# Patient Record
Sex: Male | Born: 1985 | Race: White | Hispanic: No | Marital: Married | Smoking: Current some day smoker
Health system: Southern US, Community
[De-identification: ages and names within clinical notes are randomized; demographics above are authoritative.]

## PROBLEM LIST (undated history)

## (undated) DIAGNOSIS — M109 Gout, unspecified: Secondary | ICD-10-CM

## (undated) DIAGNOSIS — F419 Anxiety disorder, unspecified: Secondary | ICD-10-CM

## (undated) DIAGNOSIS — E785 Hyperlipidemia, unspecified: Secondary | ICD-10-CM

## (undated) DIAGNOSIS — F32A Depression, unspecified: Secondary | ICD-10-CM

## (undated) HISTORY — DX: Anxiety disorder, unspecified: F41.9

## (undated) HISTORY — PX: TESTICLE TORSION REDUCTION: SHX795

## (undated) HISTORY — DX: Hyperlipidemia, unspecified: E78.5

## (undated) HISTORY — DX: Depression, unspecified: F32.A

## (undated) HISTORY — DX: Gout, unspecified: M10.9

## (undated) HISTORY — PX: WISDOM TOOTH EXTRACTION: SHX21

---

## 2012-06-19 DIAGNOSIS — K589 Irritable bowel syndrome without diarrhea: Secondary | ICD-10-CM | POA: Insufficient documentation

## 2015-08-07 DIAGNOSIS — E786 Lipoprotein deficiency: Secondary | ICD-10-CM | POA: Insufficient documentation

## 2015-08-07 DIAGNOSIS — E782 Mixed hyperlipidemia: Secondary | ICD-10-CM | POA: Insufficient documentation

## 2016-08-17 DIAGNOSIS — R7303 Prediabetes: Secondary | ICD-10-CM | POA: Insufficient documentation

## 2016-08-17 DIAGNOSIS — E669 Obesity, unspecified: Secondary | ICD-10-CM | POA: Insufficient documentation

## 2016-08-17 DIAGNOSIS — F1010 Alcohol abuse, uncomplicated: Secondary | ICD-10-CM | POA: Insufficient documentation

## 2016-08-17 DIAGNOSIS — E66811 Obesity, class 1: Secondary | ICD-10-CM | POA: Insufficient documentation

## 2016-11-22 DIAGNOSIS — F411 Generalized anxiety disorder: Secondary | ICD-10-CM | POA: Insufficient documentation

## 2017-02-22 DIAGNOSIS — F3341 Major depressive disorder, recurrent, in partial remission: Secondary | ICD-10-CM | POA: Insufficient documentation

## 2021-05-11 ENCOUNTER — Ambulatory Visit: Admission: EM | Admit: 2021-05-11 | Payer: BC Managed Care – PPO | Source: Home / Self Care

## 2021-05-11 ENCOUNTER — Other Ambulatory Visit: Payer: Self-pay

## 2021-05-11 ENCOUNTER — Ambulatory Visit
Admission: EM | Admit: 2021-05-11 | Discharge: 2021-05-11 | Disposition: A | Discharge status: Home / Self Care | Payer: BC Managed Care – PPO | Attending: Sports Medicine | Admitting: Sports Medicine

## 2021-05-11 DIAGNOSIS — M25562 Pain in left knee: Secondary | ICD-10-CM | POA: Insufficient documentation

## 2021-05-11 DIAGNOSIS — M10062 Idiopathic gout, left knee: Secondary | ICD-10-CM | POA: Insufficient documentation

## 2021-05-11 DIAGNOSIS — M25462 Effusion, left knee: Secondary | ICD-10-CM | POA: Insufficient documentation

## 2021-05-11 LAB — CBC WITH DIFFERENTIAL/PLATELET
Abs Immature Granulocytes: 0.02 10*3/uL (ref 0.00–0.07)
Basophils Absolute: 0 10*3/uL (ref 0.0–0.1)
Basophils Relative: 0 %
Eosinophils Absolute: 0 10*3/uL (ref 0.0–0.5)
Eosinophils Relative: 0 %
HCT: 43.7 % (ref 39.0–52.0)
Hemoglobin: 15.4 g/dL (ref 13.0–17.0)
Immature Granulocytes: 0 %
Lymphocytes Relative: 20 %
Lymphs Abs: 1.7 10*3/uL (ref 0.7–4.0)
MCH: 30.3 pg (ref 26.0–34.0)
MCHC: 35.2 g/dL (ref 30.0–36.0)
MCV: 86 fL (ref 80.0–100.0)
Monocytes Absolute: 0.5 10*3/uL (ref 0.1–1.0)
Monocytes Relative: 5 %
Neutro Abs: 6.6 10*3/uL (ref 1.7–7.7)
Neutrophils Relative %: 75 %
Platelets: 273 10*3/uL (ref 150–400)
RBC: 5.08 MIL/uL (ref 4.22–5.81)
RDW: 11.7 % (ref 11.5–15.5)
WBC: 8.8 10*3/uL (ref 4.0–10.5)
nRBC: 0 % (ref 0.0–0.2)

## 2021-05-11 LAB — C-REACTIVE PROTEIN: CRP: 9.3 mg/dL — ABNORMAL HIGH (ref <1.0)

## 2021-05-11 LAB — COMPREHENSIVE METABOLIC PANEL
ALT: 24 U/L (ref 0–44)
AST: 18 U/L (ref 15–41)
Albumin: 4.7 g/dL (ref 3.5–5.0)
Alkaline Phosphatase: 99 U/L (ref 38–126)
Anion gap: 10 (ref 5–15)
BUN: 15 mg/dL (ref 6–20)
CO2: 25 mmol/L (ref 22–32)
Calcium: 9.4 mg/dL (ref 8.9–10.3)
Chloride: 102 mmol/L (ref 98–111)
Creatinine, Ser: 0.95 mg/dL (ref 0.61–1.24)
GFR, Estimated: >60 mL/min (ref >60)
Glucose, Bld: 97 mg/dL (ref 70–99)
Potassium: 3.9 mmol/L (ref 3.5–5.1)
Sodium: 137 mmol/L (ref 135–145)
Total Bilirubin: 0.6 mg/dL (ref 0.3–1.2)
Total Protein: 8.6 g/dL — ABNORMAL HIGH (ref 6.5–8.1)

## 2021-05-11 LAB — URIC ACID: Uric Acid, Serum: 10.2 mg/dL — ABNORMAL HIGH (ref 3.7–8.6)

## 2021-05-11 LAB — SEDIMENTATION RATE: Sed Rate: 18 mm/hr — ABNORMAL HIGH (ref 0–15)

## 2021-05-11 MED ORDER — KETOROLAC TROMETHAMINE 60 MG/2ML IM SOLN
60.0000 mg | Freq: Once | INTRAMUSCULAR | Status: AC
  Administered 2021-05-11: 60 mg via INTRAMUSCULAR

## 2021-05-11 MED ORDER — PREDNISONE 10 MG (21) PO TBPK: ORAL_TABLET | Freq: Every day | ORAL | 0 refills | Status: DC

## 2021-05-11 MED ORDER — TRAMADOL HCL 50 MG PO TABS: 50.0000 mg | ORAL_TABLET | Freq: Four times a day (QID) | ORAL | 0 refills | Status: DC | PRN

## 2021-05-11 NOTE — ED Provider Notes (Signed)
[name]    CSN: [id number] Arrival date & time: 05/11/21  1036      History   Chief Complaint Chief Complaint  Patient presents with   Knee Pain    left    HPI Timothy Lawrence is a 35 y.o. male.   35 year old male presents for evaluation of significantly painful swollen left knee.  He denies any accidents trauma or falls.  He has had it now for 5 days and is progressed to the point where he is in tears taking history and during the physical exam.  He has pain ambulating.  He can get comfortable at home.  No fever shakes chills.  Thinks there might be a little bit of warmth to it.  He had similar symptoms about a year ago and was told he had gout.  He was prescribed allopurinol but he did not take it because he read the side effects.  He works as an Chief Financial Officer.  He does not have a primary care provider.  He denies any nausea vomiting or diarrhea.  No urinary or abdominal symptoms.  No COVID symptoms.  No URI symptoms.  No open wounds or active infection.  No recent travel.  No tick bites.  No red flag signs or symptoms elicited on history.   History reviewed. No pertinent past medical history.  There are no problems to display for this patient.   Past Surgical History:  Procedure Laterality Date   TESTICLE TORSION REDUCTION     WISDOM TOOTH EXTRACTION         Home Medications    Prior to Admission medications   Medication Sig Start Date End Date Taking? Authorizing Provider  predniSONE (STERAPRED UNI-PAK 21 TAB) 10 MG (21) TBPK tablet Take by mouth daily. Take 6 tabs by mouth daily  for 2 days, then 5 tabs for 2 days, then 4 tabs for 2 days, then 3 tabs for 2 days, 2 tabs for 2 days, then 1 tab by mouth daily for 2 days 05/11/21  Yes Verda Cumins, MD  traMADol (ULTRAM) 50 MG tablet Take 1 tablet (50 mg total) by mouth every 6 (six) hours as needed. 05/11/21  Yes Verda Cumins, MD    Family History Family History  Problem Relation Age of Onset   Multiple  sclerosis Mother    Healthy Father     Social History Social History   Tobacco Use   Smoking status: Some Days    Types: Cigars   Smokeless tobacco: Never  Vaping Use   Vaping Use: Never used  Substance Use Topics   Alcohol use: Yes    Comment: occasionally   Drug use: Not Currently     Allergies   Amoxicillin   Review of Systems Review of Systems  Constitutional:  Positive for activity change. Negative for appetite change, chills, diaphoresis, fatigue and fever.  HENT:  Negative for congestion, ear pain, postnasal drip, rhinorrhea, sinus pressure, sinus pain, sneezing and sore throat.   Eyes:  Negative for pain.  Respiratory:  Negative for cough, chest tightness and shortness of breath.   Cardiovascular:  Negative for chest pain and palpitations.  Gastrointestinal:  Negative for abdominal pain, diarrhea, nausea and vomiting.  Genitourinary:  Negative for dysuria.  Musculoskeletal:  Positive for arthralgias, gait problem and joint swelling. Negative for back pain, myalgias, neck pain and neck stiffness.  Skin:  Negative for pallor, rash and wound.  Neurological:  Negative for dizziness, light-headedness and headaches.  All other systems reviewed  and are negative.   Physical Exam Triage Vital Signs ED Triage Vitals  Enc Vitals Group     BP 05/11/21 1217 (!) 134/99     Pulse Rate 05/11/21 1217 84     Resp 05/11/21 1217 18     Temp 05/11/21 1217 98.7 F (37.1 C)     Temp Source 05/11/21 1217 Oral     SpO2 05/11/21 1217 99 %     Weight 05/11/21 1214 200 lb (90.7 kg)     Height 05/11/21 1214 '5\' 6"'  (1.676 m)     Head Circumference --      Peak Flow --      Pain Score 05/11/21 1214 8     Pain Loc --      Pain Edu? --      Excl. in [place]? --    No data found.  Updated Vital Signs BP (!) 134/99 (BP Location: Left Arm)   Pulse 84   Temp 98.7 F (37.1 C) (Oral)   Resp 18   Ht '5\' 6"'  (1.676 m)   Wt 90.7 kg   SpO2 99%   BMI 32.28 kg/m   Visual Acuity Right Eye  Distance:   Left Eye Distance:   Bilateral Distance:    Right Eye Near:   Left Eye Near:    Bilateral Near:     Physical Exam Vitals and nursing note reviewed.  Constitutional:      General: He is not in acute distress.    Appearance: Normal appearance. He is not ill-appearing, toxic-appearing or diaphoretic.  HENT:     Head: Normocephalic and atraumatic.     Nose: Nose normal.     Mouth/Throat:     Mouth: Mucous membranes are moist.  Eyes:     Conjunctiva/sclera: Conjunctivae normal.     Pupils: Pupils are equal, round, and reactive to light.  Cardiovascular:     Rate and Rhythm: Normal rate and regular rhythm.     Pulses: Normal pulses.     Heart sounds: Normal heart sounds. No murmur heard.   No friction rub. No gallop.  Pulmonary:     Effort: Pulmonary effort is normal.     Breath sounds: Normal breath sounds. No stridor. No wheezing, rhonchi or rales.  Musculoskeletal:        General: Swelling and tenderness present. No signs of injury.     Cervical back: Normal range of motion and neck supple.     Right knee: Normal.     Left knee: Swelling present. No deformity, erythema or ecchymosis. Decreased range of motion. Tenderness present.     Comments: Very limited exam with decreased range of motion, effusion, and significant pain.  Skin:    General: Skin is warm and dry.     Capillary Refill: Capillary refill takes less than 2 seconds.     Coloration: Skin is not jaundiced.     Findings: No bruising, erythema, lesion or rash.  Neurological:     General: No focal deficit present.     Mental Status: He is alert and oriented to person, place, and time.     UC Treatments / Results  Labs (all labs ordered are listed, but only abnormal results are displayed) Labs Reviewed  COMPREHENSIVE METABOLIC PANEL - Abnormal; Notable for the following components:      Result Value   Total Protein 8.6 (*)    All other components within normal limits  URIC ACID - Abnormal; Notable  for the following components:  Uric Acid, Serum 10.2 (*)    All other components within normal limits  SEDIMENTATION RATE - Abnormal; Notable for the following components:   Sed Rate 18 (*)    All other components within normal limits  C-REACTIVE PROTEIN - Abnormal; Notable for the following components:   CRP 9.3 (*)    All other components within normal limits  CBC WITH DIFFERENTIAL/PLATELET    EKG   Radiology No results found.  Procedures Procedures (including critical care time)  Medications Ordered in UC Medications  ketorolac (TORADOL) injection 60 mg (60 mg Intramuscular Given 05/11/21 1433)    Initial Impression / Assessment and Plan / UC Course  I have reviewed the triage vital signs and the nursing notes.  Pertinent labs & imaging results that were available during my care of the patient were reviewed by me and considered in my medical decision making (see chart for details).  Clinical impression: 35 year old male presents with left knee pain and swelling.  He has a background history of gout but has not taken any medicine.  Believe he has an acute gout flare.  No trauma.  No fever.  No erythema or concerning signs for an intra-articular infection.  Treatment plan: 1.  The findings and treatment plan were discussed in detail with the patient.  Patient was in agreement. 2.  Recommended getting some labs.  We did do a CBC which was normal.  C-Met showed a slight elevation in his protein but was otherwise within normal limits.  Uric acid was elevated at 10.2.  Sed rate was elevated at 18.  ESR was elevated at 9.3.  Is consistent with an acute gouty flare. 3.  Given his pain I went ahead and gave him 60 mg of IM Toradol in the office. 4.  I went ahead and gave him a 12-day prednisone taper which should help his symptoms.  I also prescribed some tramadol for his acute pain. 5.  Educational handouts provided. 6.  As he does not have a PCP I gave him the name of a local  orthopedic group that he should follow-up with to see whether or not an aspiration and culture as well as fluid analysis is indicated. 7.  He will need to be on allopurinol once he has this acute flare taken care of.  I had a long discussion with him about the importance of this so that he does not get another attack like this. 8.  I want him to keep a food log to see if there are any foods that is precipitating this.  He may well have CPPD as well. 9.  If he is not able to get into Ortho and his symptoms do not resolve then he should go to the ER for higher level of care. 10.  He was stable upon discharge and will follow-up here as needed.    Final Clinical Impressions(s) / UC Diagnoses   Final diagnoses:  Acute pain of left knee  Swelling of joint, knee, left  Acute idiopathic gout of left knee     Discharge Instructions      As we discussed, your uric acid level is quite high.  This is indicative of gout. I prescribed a 12-day prednisone taper which should help with your symptoms. We also gave you an intramuscular medicine for your pain. I also prescribed you some pain medication. You indicated that you did not need a work note. I have provided you educational handouts. Since you do not have a  primary care physician I have provided you the name of a local orthopedic group that you should follow-up with to see whether or not they can drain your knee and put some steroid in there to help with your pain and get you back on your feet quickly. If you are not able to get into orthopedics you could go to the ER and they could probably do the same.  If you worsen at all go to the ER.     ED Prescriptions     Medication Sig Dispense Auth. Provider   predniSONE (STERAPRED UNI-PAK 21 TAB) 10 MG (21) TBPK tablet Take by mouth daily. Take 6 tabs by mouth daily  for 2 days, then 5 tabs for 2 days, then 4 tabs for 2 days, then 3 tabs for 2 days, 2 tabs for 2 days, then 1 tab by mouth daily for  2 days 42 tablet Verda Cumins, MD   traMADol (ULTRAM) 50 MG tablet Take 1 tablet (50 mg total) by mouth every 6 (six) hours as needed. 15 tablet Verda Cumins, MD      I have reviewed the PDMP during this encounter.   Verda Cumins, MD 05/13/21 [phone]

## 2021-05-11 NOTE — ED Triage Notes (Signed)
Patient complains of left knee pain that started on Friday without injury. States that he is concerned this is gout.

## 2021-05-11 NOTE — Discharge Instructions (Addendum)
As we discussed, your uric acid level is quite high.  This is indicative of gout. I prescribed a 12-day prednisone taper which should help with your symptoms. We also gave you an intramuscular medicine for your pain. I also prescribed you some pain medication. You indicated that you did not need a work note. I have provided you educational handouts. Since you do not have a primary care physician I have provided you the name of a local orthopedic group that you should follow-up with to see whether or not they can drain your knee and put some steroid in there to help with your pain and get you back on your feet quickly. If you are not able to get into orthopedics you could go to the ER and they could probably do the same.  If you worsen at all go to the ER.

## 2021-05-13 DIAGNOSIS — M1A09X0 Idiopathic chronic gout, multiple sites, without tophus (tophi): Secondary | ICD-10-CM | POA: Insufficient documentation

## 2021-05-13 DIAGNOSIS — M109 Gout, unspecified: Secondary | ICD-10-CM | POA: Insufficient documentation

## 2021-05-13 DIAGNOSIS — M25562 Pain in left knee: Secondary | ICD-10-CM | POA: Diagnosis not present

## 2021-05-13 DIAGNOSIS — M25571 Pain in right ankle and joints of right foot: Secondary | ICD-10-CM | POA: Diagnosis not present

## 2021-05-14 ENCOUNTER — Telehealth: Payer: Self-pay

## 2021-05-14 NOTE — Telephone Encounter (Signed)
-----   Message from Aaron Edelman, RN sent at 05/14/2021  2:38 PM EDT ----- Regarding: UC to PCP Patient needs to establish with PCP - routine

## 2021-05-14 NOTE — Telephone Encounter (Signed)
Left voice mail to set up new patient appointment 

## 2021-05-27 ENCOUNTER — Other Ambulatory Visit: Payer: Self-pay

## 2021-05-27 ENCOUNTER — Other Ambulatory Visit: Payer: Self-pay | Admitting: Family Medicine

## 2021-05-27 ENCOUNTER — Encounter: Payer: Self-pay | Admitting: Family Medicine

## 2021-05-27 ENCOUNTER — Ambulatory Visit (INDEPENDENT_AMBULATORY_CARE_PROVIDER_SITE_OTHER): Payer: BC Managed Care – PPO | Admitting: Family Medicine

## 2021-05-27 ENCOUNTER — Inpatient Hospital Stay (INDEPENDENT_AMBULATORY_CARE_PROVIDER_SITE_OTHER): Payer: BC Managed Care – PPO | Admitting: Radiology

## 2021-05-27 VITALS — BP 124/86 | HR 68 | Temp 98.3°F | Ht 66.0 in | Wt 194.0 lb

## 2021-05-27 DIAGNOSIS — M1A09X0 Idiopathic chronic gout, multiple sites, without tophus (tophi): Secondary | ICD-10-CM | POA: Diagnosis not present

## 2021-05-27 DIAGNOSIS — M25462 Effusion, left knee: Secondary | ICD-10-CM | POA: Diagnosis not present

## 2021-05-27 HISTORY — DX: Effusion, left knee: M25.462

## 2021-05-27 MED ORDER — COLCHICINE 0.6 MG PO TABS
0.6000 mg | ORAL_TABLET | Freq: Two times a day (BID) | ORAL | 0 refills | Status: DC
Start: 2021-05-27 — End: 2021-06-24

## 2021-05-27 MED ORDER — TRIAMCINOLONE ACETONIDE 40 MG/ML IJ SUSP
40.0000 mg | Freq: Once | INTRAMUSCULAR | Status: AC
Start: 2021-05-27 — End: 2021-05-27
  Administered 2021-05-27: 40 mg

## 2021-05-27 NOTE — Assessment & Plan Note (Addendum)
Patient with stated history of chronic gout flares, typically associated with food/beer indulgences.  Has previously been prescribed allopurinol but decided to attempt dietary modifications which have been successful until this recent flare noted on 05/06/2021, went to urgent care on 05/11/2021 where uric acid level was seen to be elevated, treated with prednisone and dietary modifications.  Symptoms initially improved however they began to recur towards the end of the prednisone dose.  Currently his symptoms are primarily located at the left knee and right ankle, associated with swelling.  Pain control has been with OTC NSAIDs.  Examination significant for left knee to have 1-2+ effusion, general tenderness, no erythema, right ankle with mild anteromedial joint line pain.  Given the left knee effusion and symptomatology, he did consent to ultrasound-guided aspiration followed by intra-articular corticosteroid injection.  10 cc of synovial fluid that was somewhat cloudy in appearance was obtained, this is to be sent for further lab analysis with cultures and crystal evaluation.  Additionally, serum uric acid to be obtained so we can trend his prior value.  For additional symptom control colchicine twice daily scheduled advised as well as as needed Tylenol, and I did provide dietary guidance information for gout to the patient today.  He will return for follow-up in 2 weeks for reevaluation.

## 2021-05-27 NOTE — Addendum Note (Signed)
Addended by: Lennart Pall on: 05/27/2021 01:59 PM   Modules accepted: Orders

## 2021-05-27 NOTE — Patient Instructions (Signed)
You have just been given a cortisone injection to reduce pain and inflammation. After the injection you may notice immediate relief of pain as a result of the Lidocaine. It is important to rest the area of the injection for 24 to 48 hours after the injection. There is a possibility of some temporary increased discomfort and swelling for up to 72 hours until the cortisone begins to work. If you do have pain, simply rest the joint and use ice. If you can tolerate over the counter medications, you can try Tylenol, Aleve, or Advil for added relief per package instructions. - Adjust Ace wrap for comfort/circulation, can loosen throughout the day, remove at bedtime - Bandage to remain in place until injection site heals (can replace bandage as needed) - Dose colchicine twice daily with food until follow-up (hold off from any other NSAIDs-ibuprofen, naproxen) - Can apply ice at 20-minute intervals and dose Tylenol (acetaminophen) for additional symptom control - Obtain blood work with order provided - Return for follow-up in 2 weeks, contact for questions between now and then

## 2021-05-27 NOTE — Assessment & Plan Note (Signed)
Most likely secondary to gout, differential can include pseudogout, aspiration performed and fluid sent for analysis.  We will follow results accordingly.

## 2021-05-27 NOTE — Progress Notes (Signed)
Primary Care / Sports Medicine Office Visit  Patient Information:  Patient ID: Timothy Lawrence, male DOB: Sep 20, 1986 Age: 35 y.o. MRN: [id number]   Timothy Lawrence is a pleasant 35 y.o. male presenting with the following:  Chief Complaint  Patient presents with   New Patient (Initial Visit)   Establish Care   Gout    Right ankle and left knee; flare about 3 weeks ago and seen in urgent care, but pain is still lingering per patient; took prednisone taper and tramadol with some relief; no prior falls or injuries to knees or ankles; 3/10 pain    Review of Systems pertinent details above   Patient Active Problem List   Diagnosis Date Noted   Effusion, left knee 05/27/2021   Chronic gout of multiple sites 05/27/2021   Recurrent major depressive disorder, in partial remission (HCC) 02/22/2017   GAD (generalized anxiety disorder) 11/22/2016   Alcohol use disorder, mild, abuse 08/17/2016   Prediabetes 08/17/2016   Obesity (BMI 30.0-34.9) 08/17/2016   Hyperlipidemia, mixed 08/07/2015   Low HDL (under 40) 08/07/2015   IBS (irritable bowel syndrome) 06/19/2012   Past Medical History:  Diagnosis Date   Anxiety    Depression    Gout    Hyperlipidemia    Outpatient Encounter Medications as of 05/27/2021  Medication Sig   acetaminophen (TYLENOL) 500 MG tablet Take 1,000 mg by mouth every 4 (four) hours as needed for moderate pain.   colchicine 0.6 MG tablet Take 1 tablet (0.6 mg total) by mouth 2 (two) times daily.   [DISCONTINUED] predniSONE (STERAPRED UNI-PAK 21 TAB) 10 MG (21) TBPK tablet Take by mouth daily. Take 6 tabs by mouth daily  for 2 days, then 5 tabs for 2 days, then 4 tabs for 2 days, then 3 tabs for 2 days, 2 tabs for 2 days, then 1 tab by mouth daily for 2 days   [DISCONTINUED] traMADol (ULTRAM) 50 MG tablet Take 1 tablet (50 mg total) by mouth every 6 (six) hours as needed.   No facility-administered encounter medications on file as of 05/27/2021.   Past Surgical History:   Procedure Laterality Date   TESTICLE TORSION REDUCTION     WISDOM TOOTH EXTRACTION      Vitals:   05/27/21 1039  BP: 124/86  Pulse: 68  Temp: 98.3 F (36.8 C)  SpO2: 98%   Vitals:   05/27/21 1039  Weight: 194 lb (88 kg)  Height: 5\' 6"  (1.676 m)   Body mass index is 31.31 kg/m.  No results found.   Independent interpretation of notes and tests performed by another provider:   None  Procedures performed:   Procedure: Aspiration and injection of left suprapatellar knee under ultrasound guidance. Ultrasound guidance utilized in a transverse manner for needle placement, visualization of effusion, confirmation of resolution Samsung HS60 device utilized with permanent recording / reporting. Consent obtained and verified. Skin prepped in a sterile fashion. Ethyl chloride spray for topical local analgesia.  Completed without difficulty and tolerated well.  10 cc turbid synovial fluid obtained. Medication: triamcinolone acetonide 40 mg/mL suspension for injection 1 mL total and 2 mL lidocaine 1% without epinephrine utilized for needle placement anesthetic Advised to contact for fevers/chills, erythema, induration, drainage, or persistent bleeding.   Pertinent History, Exam, Impression, and Recommendations:   Chronic gout of multiple sites Patient with stated history of chronic gout flares, typically associated with food/beer indulgences.  Has previously been prescribed allopurinol but decided to attempt dietary modifications which have  been successful until this recent flare noted on 05/06/2021, went to urgent care on 05/11/2021 where uric acid level was seen to be elevated, treated with prednisone and dietary modifications.  Symptoms initially improved however they began to recur towards the end of the prednisone dose.  Currently his symptoms are primarily located at the left knee and right ankle, associated with swelling.  Pain control has been with OTC NSAIDs.  Examination  significant for left knee to have 1-2+ effusion, general tenderness, no erythema, right ankle with mild anteromedial joint line pain.  Given the left knee effusion and symptomatology, he did consent to ultrasound-guided aspiration followed by intra-articular corticosteroid injection.  10 cc of synovial fluid that was somewhat cloudy in appearance was obtained, this is to be sent for further lab analysis with cultures and crystal evaluation.  Additionally, serum uric acid to be obtained so we can trend his prior value.  For additional symptom control colchicine twice daily scheduled advised as well as as needed Tylenol, and I did provide dietary guidance information for gout to the patient today.  He will return for follow-up in 2 weeks for reevaluation.  Effusion, left knee Most likely secondary to gout, differential can include pseudogout, aspiration performed and fluid sent for analysis.  We will follow results accordingly.    Orders & Medications Meds ordered this encounter  Medications   colchicine 0.6 MG tablet    Sig: Take 1 tablet (0.6 mg total) by mouth 2 (two) times daily.    Dispense:  60 tablet    Refill:  0   Orders Placed This Encounter  Procedures   Anaerobic and Aerobic Culture   [place] LIMITED JOINT SPACE STRUCTURES LOW LEFT   Body Fluid Crystal   Synovial Fluid Panel   Uric acid     Return in about 2 years (around 05/28/2023) for follow-up.     Jerrol Banana, MD   Primary Care Sports Medicine [facility]

## 2021-05-28 ENCOUNTER — Telehealth: Payer: Self-pay

## 2021-05-28 LAB — URIC ACID: Uric Acid: 8.9 mg/dL — ABNORMAL HIGH (ref 3.8–8.4)

## 2021-05-28 NOTE — Progress Notes (Signed)
Please let patient know that the uric acid level is still elevated (though improved from the last it was checked). He is to continue medication and we will check in again once the knee fluid results are in.  Please also remind him to sign up for MyChart for ease of communication.

## 2021-05-28 NOTE — Telephone Encounter (Signed)
Spoke with [place] and clarified orders. No further questions.

## 2021-05-28 NOTE — Telephone Encounter (Signed)
Copied from CRM [id number]. Topic: General - Inquiry >> May 28, 2021 12:03 PM Daphine Deutscher D wrote: Reason for CRM: Revonda Standard with [facility] called needing a clarification on the tst sample and code for patients lab  CB#  [phone]  x option [phone]

## 2021-06-10 ENCOUNTER — Other Ambulatory Visit: Payer: Self-pay

## 2021-06-10 ENCOUNTER — Ambulatory Visit
Admission: RE | Admit: 2021-06-10 | Discharge: 2021-06-10 | Disposition: A | Discharge status: Home / Self Care | Payer: BC Managed Care – PPO | Source: Ambulatory Visit | Attending: Family Medicine | Admitting: Family Medicine

## 2021-06-10 ENCOUNTER — Encounter: Payer: Self-pay | Admitting: Family Medicine

## 2021-06-10 ENCOUNTER — Ambulatory Visit
Admission: RE | Admit: 2021-06-10 | Discharge: 2021-06-10 | Disposition: A | Discharge status: Home / Self Care | Payer: BC Managed Care – PPO | Attending: Family Medicine | Admitting: Family Medicine

## 2021-06-10 ENCOUNTER — Ambulatory Visit (INDEPENDENT_AMBULATORY_CARE_PROVIDER_SITE_OTHER): Payer: BC Managed Care – PPO | Admitting: Family Medicine

## 2021-06-10 VITALS — BP 112/80 | HR 62 | Temp 98.1°F | Ht 66.0 in | Wt 190.0 lb

## 2021-06-10 DIAGNOSIS — R7989 Other specified abnormal findings of blood chemistry: Secondary | ICD-10-CM

## 2021-06-10 DIAGNOSIS — Z13220 Encounter for screening for lipoid disorders: Secondary | ICD-10-CM | POA: Diagnosis not present

## 2021-06-10 DIAGNOSIS — R1031 Right lower quadrant pain: Secondary | ICD-10-CM | POA: Insufficient documentation

## 2021-06-10 DIAGNOSIS — N200 Calculus of kidney: Secondary | ICD-10-CM | POA: Diagnosis not present

## 2021-06-10 DIAGNOSIS — M1A09X0 Idiopathic chronic gout, multiple sites, without tophus (tophi): Secondary | ICD-10-CM | POA: Diagnosis not present

## 2021-06-10 DIAGNOSIS — Z0000 Encounter for general adult medical examination without abnormal findings: Secondary | ICD-10-CM | POA: Insufficient documentation

## 2021-06-10 NOTE — Assessment & Plan Note (Signed)
Recent uric acid level was shown to have decreased however still above the normal range.  From a symptom standpoint, he has noted improvement though not fully asymptomatic, this is with scheduled dosing of colchicine.  He does admit to recent food/beverage indulgence while on vacation.  Despite this no uptick in symptoms.  Plan to recheck uric acid today, discontinue colchicine.  Pending uric acid level, determination for allopurinol versus continued diet alone measures to be made.  Patient is understanding and amenable to this plan.

## 2021-06-10 NOTE — Assessment & Plan Note (Signed)
Annual physical examination complete, anticipatory guidance provided, risk stratification labs ordered.  Advised to obtain flu vaccine once available.

## 2021-06-10 NOTE — Progress Notes (Signed)
Annual Physical Exam Visit  Patient Information:  Patient ID: Timothy Lawrence, male DOB: 1986-06-11 Age: 35 y.o. MRN: [id number]   Subjective:   CC: Annual Physical Exam  HPI:  Timothy Lawrence is here for their annual physical.  I reviewed the past medical history, family history, social history, surgical history, and allergies today and changes were made as necessary.  Please see the problem list section below for additional details.  Health Habits Eye Exam: Longer than a year Dental Exam: Within last 6 months, rec every 6 months Exercise: walking, 20 minutes / day, 3-4 Diet: mostly eating in, lot of veggies, not too much with fruits  Sexual Health Sexually active: yes Current contraception: partner on OCP  Past Medical History: Past Medical History:  Diagnosis Date   Anxiety    Depression    Effusion, left knee 05/27/2021   Gout    Hyperlipidemia    Past Surgical History: Past Surgical History:  Procedure Laterality Date   TESTICLE TORSION REDUCTION     WISDOM TOOTH EXTRACTION     Social History: Social History   Socioeconomic History   Marital status: Married    Spouse name: Aric Jost   Number of children: 1   Years of education: 16   Highest education level: Bachelor's degree (e.g., BA, AB, BS)  Occupational History   Occupation: Surveyor, minerals  Tobacco Use   Smoking status: Some Days    Types: Cigars   Smokeless tobacco: Never  Vaping Use   Vaping Use: Never used  Substance and Sexual Activity   Alcohol use: Yes    Alcohol/week: 4.0 standard drinks    Types: 4 Standard drinks or equivalent per week   Drug use: Not Currently   Sexual activity: Yes    Partners: Female  Other Topics Concern   Not on file  Social History Narrative   Not on file   Social Determinants of Health   Financial Resource Strain: Not on file  Food Insecurity: Not on file  Transportation Needs: Not on file  Physical Activity: Not on file  Stress: Not on file   Social Connections: Not on file   Family History: Family History  Problem Relation Age of Onset   Multiple sclerosis Mother    Healthy Father    Gout Brother    Heart attack Maternal Grandfather    Alzheimer's disease Paternal Grandmother    Allergies: Allergies  Allergen Reactions   Amoxicillin Hives   Health Maintenance: Health Maintenance  Topic Date Due   Pneumococcal Vaccine 66-88 Years old (1 - PCV) Never done   HIV Screening  Never done   Hepatitis C Screening  Never done   COVID-19 Vaccine (3 - Booster for Pfizer series) 05/19/2020   INFLUENZA VACCINE  05/17/2021   TETANUS/TDAP  09/02/2025   HPV VACCINES  Aged Out    HM Colonoscopy     This patient has no relevant Health Maintenance data.      Medications: Current Outpatient Medications on File Prior to Visit  Medication Sig Dispense Refill   acetaminophen (TYLENOL) 500 MG tablet Take 1,000 mg by mouth every 4 (four) hours as needed for moderate pain.     colchicine 0.6 MG tablet Take 1 tablet (0.6 mg total) by mouth 2 (two) times daily. 60 tablet 0   No current facility-administered medications on file prior to visit.    Review of Systems: No headache, visual changes, nausea, vomiting, diarrhea, constipation, dizziness, +abdominal pain, skin rash,  fevers, chills, night sweats, swollen lymph nodes, weight loss, chest pain, body aches, joint swelling, muscle aches, shortness of breath, mood changes, visual or auditory hallucinations reported.  Objective:   Vitals:   06/10/21 0820  BP: 112/80  Pulse: 62  Temp: 98.1 F (36.7 C)  SpO2: 98%   Vitals:   06/10/21 0820  Weight: 190 lb (86.2 kg)  Height: 5\' 6"  (1.676 m)   Body mass index is 30.67 kg/m.  General: Well Developed, well nourished, and in no acute distress.  Neuro: Alert and oriented x3, extra-ocular muscles intact, sensation grossly intact. Cranial nerves II through XII are intact, motor, sensory, and coordinative functions are all  intact. HEENT: Normocephalic, atraumatic, pupils equal round reactive to light, neck supple, no masses, no lymphadenopathy, thyroid nonpalpable. Oropharynx, nasopharynx, external ear canals are unremarkable. Skin: Warm and dry, no rashes noted.  Cardiac: Regular rate and rhythm, no murmurs rubs or gallops. No peripheral edema. Pulses symmetric. Respiratory: Clear to auscultation bilaterally. Not using accessory muscles, speaking in full sentences.  Abdominal: Soft, nontender, nondistended, positive bowel sounds, no masses, no organomegaly. Musculoskeletal: Shoulder, elbow, wrist, hip, knee, ankle stable, and with full range of motion. Resolved left knee effusion, mildly tender bilateral medial knee joint lines.   Impression and Recommendations:   The patient was counselled, risk factors were discussed, and anticipatory guidance given.  Chronic gout of multiple sites Recent uric acid level was shown to have decreased however still above the normal range.  From a symptom standpoint, he has noted improvement though not fully asymptomatic, this is with scheduled dosing of colchicine.  He does admit to recent food/beverage indulgence while on vacation.  Despite this no uptick in symptoms.  Plan to recheck uric acid today, discontinue colchicine.  Pending uric acid level, determination for allopurinol versus continued diet alone measures to be made.  Patient is understanding and amenable to this plan.  Abdominal pain, right lower quadrant Chronic issue ongoing for years, very intermittent in nature, mild in severity, right lower quadrant, unable to link this to any specific aggravating factor such as diet, activity, etc.  Denies any skin change, no radiation of symptoms, resolves with time alone.  To further evaluate this labs and x-ray ordered, will follow these results.  Annual physical exam Annual physical examination complete, anticipatory guidance provided, risk stratification labs ordered.   Advised to obtain flu vaccine once available.   Orders & Medications Medications: No orders of the defined types were placed in this encounter.  Orders Placed This Encounter  Procedures   DG Abd 1 View   TSH Rfx on Abnormal to Free T4   Lipase   Lipid panel   Amylase   CBC with Differential/Platelet   Uric acid   Hemoglobin A1c   VITAMIN D 25 Hydroxy (Vit-D Deficiency, Fractures)   Bilirubin, fractionated(tot/dir/indir)     Return in about 1 year (around 06/10/2022) for annual physical.    06/12/2022, MD   Primary Care Sports Medicine [name]

## 2021-06-10 NOTE — Assessment & Plan Note (Signed)
Chronic issue ongoing for years, very intermittent in nature, mild in severity, right lower quadrant, unable to link this to any specific aggravating factor such as diet, activity, etc.  Denies any skin change, no radiation of symptoms, resolves with time alone.  To further evaluate this labs and x-ray ordered, will follow these results.

## 2021-06-10 NOTE — Patient Instructions (Addendum)
-   Obtain fasting labs with orders provided (can have water or black coffee but otherwise no food or drink x 8 hours before labs) - Obtain x-ray downstairs - Review information provided - Stop colchicine - Continue dietary modifications - Return in 1 year for physical - Contact us for questions between now and then

## 2021-06-11 NOTE — Progress Notes (Signed)
The x-rays came back as normal. There was a slight predominance of stool in the right lower abdomen that could account for your symptoms but not a heavy stool burden throughout the bowel. For now we will await the labs (obtain those fasting if you have not already done so).

## 2021-06-17 ENCOUNTER — Encounter: Payer: Self-pay | Admitting: Family Medicine

## 2021-06-17 DIAGNOSIS — R7989 Other specified abnormal findings of blood chemistry: Secondary | ICD-10-CM

## 2021-06-17 DIAGNOSIS — M1A09X0 Idiopathic chronic gout, multiple sites, without tophus (tophi): Secondary | ICD-10-CM

## 2021-06-18 LAB — CBC WITH DIFFERENTIAL/PLATELET
Basophils Absolute: 0 10*3/uL (ref 0.0–0.2)
Basos: 1 %
EOS (ABSOLUTE): 0.1 10*3/uL (ref 0.0–0.4)
Eos: 1 %
Hematocrit: 43.9 % (ref 37.5–51.0)
Hemoglobin: 15 g/dL (ref 13.0–17.7)
Immature Grans (Abs): 0 10*3/uL (ref 0.0–0.1)
Immature Granulocytes: 0 %
Lymphocytes Absolute: 2.8 10*3/uL (ref 0.7–3.1)
Lymphs: 47 %
MCH: 30.3 pg (ref 26.6–33.0)
MCHC: 34.2 g/dL (ref 31.5–35.7)
MCV: 89 fL (ref 79–97)
Monocytes Absolute: 0.4 10*3/uL (ref 0.1–0.9)
Monocytes: 7 %
Neutrophils Absolute: 2.6 10*3/uL (ref 1.4–7.0)
Neutrophils: 44 %
Platelets: 231 10*3/uL (ref 150–450)
RBC: 4.95 x10E6/uL (ref 4.14–5.80)
RDW: 11.7 % (ref 11.6–15.4)
WBC: 5.9 10*3/uL (ref 3.4–10.8)

## 2021-06-18 LAB — HEMOGLOBIN A1C
Est. average glucose Bld gHb Est-mCnc: 105 mg/dL
Hgb A1c MFr Bld: 5.3 % (ref 4.8–5.6)

## 2021-06-18 LAB — URIC ACID: Uric Acid: 9.2 mg/dL — ABNORMAL HIGH (ref 3.8–8.4)

## 2021-06-18 LAB — LIPID PANEL
Chol/HDL Ratio: 4.2 ratio (ref 0.0–5.0)
Cholesterol, Total: 245 mg/dL — ABNORMAL HIGH (ref 100–199)
HDL: 58 mg/dL (ref >39)
LDL Chol Calc (NIH): 173 mg/dL — ABNORMAL HIGH (ref 0–99)
Triglycerides: 84 mg/dL (ref 0–149)
VLDL Cholesterol Cal: 14 mg/dL (ref 5–40)

## 2021-06-18 LAB — VITAMIN D 25 HYDROXY (VIT D DEFICIENCY, FRACTURES): Vit D, 25-Hydroxy: 21 ng/mL — ABNORMAL LOW (ref 30.0–100.0)

## 2021-06-18 LAB — BILIRUBIN, FRACTIONATED(TOT/DIR/INDIR)
Bilirubin Total: 0.4 mg/dL (ref 0.0–1.2)
Bilirubin, Direct: 0.12 mg/dL (ref 0.00–0.40)
Bilirubin, Indirect: 0.28 mg/dL (ref 0.10–0.80)

## 2021-06-18 LAB — LIPASE: Lipase: 21 U/L (ref 13–78)

## 2021-06-18 LAB — AMYLASE: Amylase: 51 U/L (ref 31–110)

## 2021-06-18 LAB — TSH RFX ON ABNORMAL TO FREE T4: TSH: 0.949 u[IU]/mL (ref 0.450–4.500)

## 2021-06-24 LAB — SYNOVIAL FLUID PANEL
Eos, Fluid: 0 %
Lining Cells, Synovial: 1 %
Lymphs, Fluid: 53 %
Macrophages Fld: 2 %
Nuc cell # Fld: 465 cells/uL — ABNORMAL HIGH (ref 0–200)
Polys, Fluid: 44 %

## 2021-06-24 LAB — RESULT

## 2021-06-24 LAB — BODY FLUID CULTURE AER/ANAER

## 2021-06-24 MED ORDER — COLCHICINE 0.6 MG PO TABS: 0.6000 mg | ORAL_TABLET | Freq: Two times a day (BID) | ORAL | 0 refills | Status: DC

## 2021-06-24 MED ORDER — VITAMIN D (ERGOCALCIFEROL) 1.25 MG (50000 UNIT) PO CAPS: 50000.0000 [IU] | ORAL_CAPSULE | ORAL | 0 refills | Status: DC

## 2021-06-24 NOTE — Telephone Encounter (Signed)
Please advise for labs.  It looks like they were resulted 06/18/21.

## 2021-06-24 NOTE — Telephone Encounter (Signed)
    Primary Care / Sports Medicine Telephone Note  Patient Information:  Patient ID: Timothy Lawrence, male DOB: Sep 14, 1986 Age: 35 y.o. MRN: [id number]   Called and discussed recent lab work with patient today.  Patient with recent serum studies revealing uric acid trending upward, additionally he states that he is beginning to note recurrent gout symptomatology and has restarted colchicine yesterday.  I have advised twice daily dosing x2 weeks, refill sent, recheck of uric acid in 2 weeks time.  Once uric acid within range and he is asymptomatic, allopurinol to be prescribed.  Once these labs resolved, our office will contact patient to review them and discuss next steps.  Additionally, continued focus on improving his diet given his elevated lipid numbers and uric acid reading.  Information sent to patient in this regard.  Lastly, low serum vitamin D, 8-week Rx course sent to his pharmacy on file.  Jerrol Banana, MD   Primary Care Sports Medicine [facility]

## 2021-06-25 NOTE — Progress Notes (Signed)
Timothy Lawrence, the knee aspirate (fluid drawn off) results arrived showing monosodium urate crystals (what we see in gout) and propionibacterium acnes which can be considered a contaminant for the sample as this is readily seen normally on the skin.

## 2021-07-08 DIAGNOSIS — M1A09X0 Idiopathic chronic gout, multiple sites, without tophus (tophi): Secondary | ICD-10-CM | POA: Diagnosis not present

## 2021-07-09 ENCOUNTER — Other Ambulatory Visit: Payer: Self-pay | Admitting: Family Medicine

## 2021-07-09 ENCOUNTER — Encounter: Payer: Self-pay | Admitting: Family Medicine

## 2021-07-09 DIAGNOSIS — M1A09X0 Idiopathic chronic gout, multiple sites, without tophus (tophi): Secondary | ICD-10-CM

## 2021-07-09 LAB — URIC ACID: Uric Acid: 9.4 mg/dL — ABNORMAL HIGH (ref 3.8–8.4)

## 2021-07-09 MED ORDER — ALLOPURINOL 100 MG PO TABS: 100.0000 mg | ORAL_TABLET | Freq: Every day | ORAL | 2 refills | Status: DC

## 2021-07-09 MED ORDER — COLCHICINE 0.6 MG PO TABS: 0.6000 mg | ORAL_TABLET | Freq: Two times a day (BID) | ORAL | 2 refills | Status: DC

## 2021-07-09 NOTE — Progress Notes (Signed)
**   Patient needs visit in a little over 2 weeks. He will get labs at the 2 week mark and his visit should be enough time afterwards to allow for results to have returned.  Patient message below: Timothy Lawrence, the uric acid has gradually continued to climb. For those with a history of gout flares, we want to target to get this number under 6.0 mg/dL.  At this stage we need to start allopurinol which will gradually lower your uric acid. It is not uncommon to see gout flares arise during start of treatment so it is important to stay on the colchicine.  We should see results in 2 weeks. We will schedule a follow-up visit a little after 2 weeks but please get new labs at that time and return for a visit a few days later. I will send enough medications to last you through that time. Please reach out for questions.

## 2021-07-22 ENCOUNTER — Other Ambulatory Visit: Payer: Self-pay | Admitting: Family Medicine

## 2021-07-22 DIAGNOSIS — M1A09X0 Idiopathic chronic gout, multiple sites, without tophus (tophi): Secondary | ICD-10-CM | POA: Diagnosis not present

## 2021-07-23 ENCOUNTER — Ambulatory Visit (INDEPENDENT_AMBULATORY_CARE_PROVIDER_SITE_OTHER): Payer: BC Managed Care – PPO | Admitting: Family Medicine

## 2021-07-23 ENCOUNTER — Encounter: Payer: Self-pay | Admitting: Family Medicine

## 2021-07-23 ENCOUNTER — Other Ambulatory Visit: Payer: Self-pay

## 2021-07-23 VITALS — BP 128/94 | HR 69 | Temp 98.7°F | Ht 66.0 in | Wt 194.0 lb

## 2021-07-23 DIAGNOSIS — M1A09X0 Idiopathic chronic gout, multiple sites, without tophus (tophi): Secondary | ICD-10-CM

## 2021-07-23 DIAGNOSIS — Z23 Encounter for immunization: Secondary | ICD-10-CM | POA: Diagnosis not present

## 2021-07-23 LAB — URIC ACID: Uric Acid: 6.4 mg/dL (ref 3.8–8.4)

## 2021-07-23 MED ORDER — ALLOPURINOL 100 MG PO TABS: 100.0000 mg | ORAL_TABLET | Freq: Every day | ORAL | 0 refills | Status: DC

## 2021-07-23 NOTE — Progress Notes (Signed)
Primary Care / Sports Medicine Office Visit  Patient Information:  Patient ID: Timothy Lawrence, male DOB: [birth year] Age: 35 y.o. MRN: [id number]   Timothy Lawrence is a pleasant 35 y.o. male presenting with the following:  Chief Complaint  Patient presents with   Follow-up   Gout    Stable; taking allopurinol daily   Abdominal Pain    Intermittent; right-sided; X-Ray 06/10/21    Review of Systems pertinent details above   Patient Active Problem List   Diagnosis Date Noted   Annual physical exam 06/10/2021   Abdominal pain, right lower quadrant 06/10/2021   Chronic gout of multiple sites 05/27/2021   Gout 05/13/2021   Recurrent major depressive disorder, in partial remission (HCC) 02/22/2017   GAD (generalized anxiety disorder) 11/22/2016   Alcohol use disorder, mild, abuse 08/17/2016   Prediabetes 08/17/2016   Obesity (BMI 30.0-34.9) 08/17/2016   Hyperlipidemia, mixed 08/07/2015   Low HDL (under 40) 08/07/2015   IBS (irritable bowel syndrome) 06/19/2012   Past Medical History:  Diagnosis Date   Anxiety    Depression    Effusion, left knee 05/27/2021   Gout    Hyperlipidemia    Outpatient Encounter Medications as of 07/23/2021  Medication Sig   acetaminophen (TYLENOL) 500 MG tablet Take 1,000 mg by mouth every 4 (four) hours as needed for moderate pain.   colchicine 0.6 MG tablet Take 1 tablet (0.6 mg total) by mouth 2 (two) times daily.   Vitamin D, Ergocalciferol, (DRISDOL) 1.25 MG (50000 UNIT) CAPS capsule Take 1 capsule (50,000 Units total) by mouth every 7 (seven) days. Take for 8 total doses(weeks)   [DISCONTINUED] allopurinol (ZYLOPRIM) 100 MG tablet Take 1 tablet (100 mg total) by mouth daily.   allopurinol (ZYLOPRIM) 100 MG tablet Take 1 tablet (100 mg total) by mouth daily.   No facility-administered encounter medications on file as of 07/23/2021.   Past Surgical History:  Procedure Laterality Date   TESTICLE TORSION REDUCTION     WISDOM TOOTH EXTRACTION       Vitals:   07/23/21 0902  BP: (!) 128/94  Pulse: 69  Temp: 98.7 F (37.1 C)  SpO2: 99%   Vitals:   07/23/21 0902  Weight: 194 lb (88 kg)  Height: 5\' 6"  (1.676 m)   Body mass index is 31.31 kg/m.  No results found.   Independent interpretation of notes and tests performed by another provider:   None  Procedures performed:   None  Pertinent History, Exam, Impression, and Recommendations:   Chronic gout of multiple sites Patient's recent uric acid levels have dropped significantly into normal range from previous study increases.  He is also asymptomatic from a musculoskeletal standpoint, tolerating the allopurinol well, and continues to make healthy dietary changes as best that he can.  I have encouraged him to continue with low purine diet, Rx for allopurinol sent in for 90 days, and he is to discontinue scheduled NSAIDs.  He can reserve OTC NSAIDs for any recurrence of joint pain, I did advise him to contact our office for any lingering joint issues but otherwise repeat labs in 3 months time, our office will reach out to him with results and next steps at that time.   Orders & Medications Meds ordered this encounter  Medications   allopurinol (ZYLOPRIM) 100 MG tablet    Sig: Take 1 tablet (100 mg total) by mouth daily.    Dispense:  90 tablet    Refill:  0   Orders  Placed This Encounter  Procedures   Flu Vaccine QUAD 56mo+IM (Fluarix, Fluzone & Alfiuria Quad PF)   Uric acid     Return if symptoms worsen or fail to improve.     Jerrol Banana, MD   Primary Care Sports Medicine [facility]

## 2021-07-23 NOTE — Patient Instructions (Signed)
-   Continue low purine dietary changes - Continue allopurinol daily - Can utilize over-the-counter NSAID (naproxen, ibuprofen) for any joint pain - If pains persist x7-10 days, contact our office next steps - Obtain new labs in 3 months, our office will contact you with results and next steps - Contact us for questions between now and then

## 2021-07-23 NOTE — Assessment & Plan Note (Signed)
Patient's recent uric acid levels have dropped significantly into normal range from previous study increases.  He is also asymptomatic from a musculoskeletal standpoint, tolerating the allopurinol well, and continues to make healthy dietary changes as best that he can.  I have encouraged him to continue with low purine diet, Rx for allopurinol sent in for 90 days, and he is to discontinue scheduled NSAIDs.  He can reserve OTC NSAIDs for any recurrence of joint pain, I did advise him to contact our office for any lingering joint issues but otherwise repeat labs in 3 months time, our office will reach out to him with results and next steps at that time.

## 2021-11-12 ENCOUNTER — Other Ambulatory Visit: Payer: Self-pay | Admitting: Family Medicine

## 2021-11-12 DIAGNOSIS — M1A09X0 Idiopathic chronic gout, multiple sites, without tophus (tophi): Secondary | ICD-10-CM

## 2021-11-12 NOTE — Telephone Encounter (Signed)
Requested medication (s) are due for refill today: expired medication  Requested medication (s) are on the active medication list: yes  Last refill:  07/23/21-10/21/21 #90 0 refills  Future visit scheduled: yes  Notes to clinic:  expired medication. Do you want to renew Rx?     Requested Prescriptions  Pending Prescriptions Disp Refills   allopurinol (ZYLOPRIM) 100 MG tablet [Pharmacy Med Name: ALLOPURINOL 100 MG TABLET] 30 tablet 2    Sig: TAKE 1 TABLET BY MOUTH EVERY DAY     Endocrinology:  Gout Agents Passed - 11/12/2021  2:34 PM      Passed - Uric Acid in normal range and within 360 days    Uric Acid  Date Value Ref Range Status  07/22/2021 6.4 3.8 - 8.4 mg/dL Final    Comment:               Therapeutic target for gout patients: <6.0          Passed - Cr in normal range and within 360 days    Creatinine, Ser  Date Value Ref Range Status  05/11/2021 0.95 0.61 - 1.24 mg/dL Final          Passed - Valid encounter within last 12 months    Recent Outpatient Visits           3 months ago Need for immunization against influenza   [facility] Jerrol Banana, MD   5 months ago Annual physical exam   [facility] Jerrol Banana, MD   5 months ago Chronic gout of multiple sites, unspecified cause   [facility] Jerrol Banana, MD       Future Appointments             In 7 months [name], [name], MD [facility]

## 2021-12-27 ENCOUNTER — Encounter: Payer: Self-pay | Admitting: Family Medicine

## 2021-12-30 ENCOUNTER — Ambulatory Visit
Admission: EM | Admit: 2021-12-30 | Discharge: 2021-12-30 | Disposition: A | Discharge status: Home / Self Care | Payer: BC Managed Care – PPO | Attending: Emergency Medicine | Admitting: Emergency Medicine

## 2021-12-30 ENCOUNTER — Ambulatory Visit (INDEPENDENT_AMBULATORY_CARE_PROVIDER_SITE_OTHER): Payer: BC Managed Care – PPO

## 2021-12-30 ENCOUNTER — Other Ambulatory Visit: Payer: Self-pay

## 2021-12-30 DIAGNOSIS — F17290 Nicotine dependence, other tobacco product, uncomplicated: Secondary | ICD-10-CM | POA: Diagnosis not present

## 2021-12-30 DIAGNOSIS — M109 Gout, unspecified: Secondary | ICD-10-CM | POA: Diagnosis not present

## 2021-12-30 DIAGNOSIS — R0789 Other chest pain: Secondary | ICD-10-CM | POA: Insufficient documentation

## 2021-12-30 DIAGNOSIS — R079 Chest pain, unspecified: Secondary | ICD-10-CM

## 2021-12-30 DIAGNOSIS — K209 Esophagitis, unspecified without bleeding: Secondary | ICD-10-CM | POA: Diagnosis not present

## 2021-12-30 LAB — COMPREHENSIVE METABOLIC PANEL
ALT: 51 U/L — ABNORMAL HIGH (ref 0–44)
AST: 37 U/L (ref 15–41)
Albumin: 4.6 g/dL (ref 3.5–5.0)
Alkaline Phosphatase: 82 U/L (ref 38–126)
Anion gap: 10 (ref 5–15)
BUN: 19 mg/dL (ref 6–20)
CO2: 22 mmol/L (ref 22–32)
Calcium: 9.1 mg/dL (ref 8.9–10.3)
Chloride: 104 mmol/L (ref 98–111)
Creatinine, Ser: 0.98 mg/dL (ref 0.61–1.24)
GFR, Estimated: >60 mL/min (ref >60)
Glucose, Bld: 104 mg/dL — ABNORMAL HIGH (ref 70–99)
Potassium: 4.1 mmol/L (ref 3.5–5.1)
Sodium: 136 mmol/L (ref 135–145)
Total Bilirubin: 0.6 mg/dL (ref 0.3–1.2)
Total Protein: 8.2 g/dL — ABNORMAL HIGH (ref 6.5–8.1)

## 2021-12-30 LAB — CBC WITH DIFFERENTIAL/PLATELET
Abs Immature Granulocytes: 0.01 10*3/uL (ref 0.00–0.07)
Basophils Absolute: 0 10*3/uL (ref 0.0–0.1)
Basophils Relative: 1 %
Eosinophils Absolute: 0.1 10*3/uL (ref 0.0–0.5)
Eosinophils Relative: 1 %
HCT: 42.9 % (ref 39.0–52.0)
Hemoglobin: 15.1 g/dL (ref 13.0–17.0)
Immature Granulocytes: 0 %
Lymphocytes Relative: 38 %
Lymphs Abs: 2.4 10*3/uL (ref 0.7–4.0)
MCH: 30.1 pg (ref 26.0–34.0)
MCHC: 35.2 g/dL (ref 30.0–36.0)
MCV: 85.6 fL (ref 80.0–100.0)
Monocytes Absolute: 0.3 10*3/uL (ref 0.1–1.0)
Monocytes Relative: 6 %
Neutro Abs: 3.3 10*3/uL (ref 1.7–7.7)
Neutrophils Relative %: 54 %
Platelets: 222 10*3/uL (ref 150–400)
RBC: 5.01 MIL/uL (ref 4.22–5.81)
RDW: 12.1 % (ref 11.5–15.5)
WBC: 6.1 10*3/uL (ref 4.0–10.5)
nRBC: 0 % (ref 0.0–0.2)

## 2021-12-30 LAB — TROPONIN I (HIGH SENSITIVITY): Troponin I (High Sensitivity): 3 ng/L (ref <18)

## 2021-12-30 MED ORDER — LIDOCAINE VISCOUS HCL 2 % MT SOLN
15.0000 mL | Freq: Once | OROMUCOSAL | Status: AC
  Administered 2021-12-30: 15 mL via ORAL

## 2021-12-30 MED ORDER — OMEPRAZOLE 20 MG PO CPDR: 20.0000 mg | DELAYED_RELEASE_CAPSULE | Freq: Every day | ORAL | 0 refills | Status: DC

## 2021-12-30 MED ORDER — ALUM & MAG HYDROXIDE-SIMETH 200-200-20 MG/5ML PO SUSP
30.0000 mL | Freq: Once | ORAL | Status: AC
Start: 2021-12-30 — End: 2021-12-30
  Administered 2021-12-30: 30 mL via ORAL

## 2021-12-30 NOTE — Discharge Instructions (Addendum)
Take the omeprazole at bedtime daily. ? ?Avoid eating greasy, spicy, or acidic foods as this may intensify your symptoms. ? ?Avoid eating within 4 hours of bedtime. ? ?You can also use over-the-counter Pepcid 20 mg twice daily to help with your symptoms. ? ?If your symptoms do not improve you need to see your PCP for a referral to gastroenterology.  ?

## 2021-12-30 NOTE — ED Provider Notes (Signed)
[name]    CSN: [id number] Arrival date & time: 12/30/21  0805      History   Chief Complaint Chief Complaint  Patient presents with   Chest Discomfort    HPI Timothy Lawrence is a 36 y.o. male.   HPI  60 old male here for evaluation of chest pain.  Patient reports that he has been experiencing a dull, constant pain substernally for the last 5 to 6 days.  He is currently rating it as a 4/10 but reports that it will occasionally get up to a 7-8/10.  When the pain is intense he does endorse possibly feeling palpitations but not on a regular basis.  He states that laying down improves his symptoms as well as movement.  He feels the pain more when he is at rest.  He denies shortness of breath or dizziness.  He also denies sweating.  He has not tried anything for this.  He states that last night and 2 nights ago he played soccer without any difficulty and states that he was not bothered by the chest pain at that time.  He does have a history of generalized anxiety disorder as well as gout multiple sites.  Also alcohol use disorder and IBS.  He denies any sour taste in his mouth or burning in his throat.  The pain is not affected by food.  Patient states that it does feel like a panic attack but he is not having the panic as he denies rapid breathing, circumoral numbness, tingling in the hands or feet, or drawing of the hands and feet.  Past Medical History:  Diagnosis Date   Anxiety    Depression    Effusion, left knee 05/27/2021   Gout    Hyperlipidemia     Patient Active Problem List   Diagnosis Date Noted   Annual physical exam 06/10/2021   Abdominal pain, right lower quadrant 06/10/2021   Chronic gout of multiple sites 05/27/2021   Gout 05/13/2021   Recurrent major depressive disorder, in partial remission (HCC) 02/22/2017   GAD (generalized anxiety disorder) 11/22/2016   Alcohol use disorder, mild, abuse 08/17/2016   Prediabetes 08/17/2016   Obesity (BMI  30.0-34.9) 08/17/2016   Hyperlipidemia, mixed 08/07/2015   Low HDL (under 40) 08/07/2015   IBS (irritable bowel syndrome) 06/19/2012    Past Surgical History:  Procedure Laterality Date   TESTICLE TORSION REDUCTION     WISDOM TOOTH EXTRACTION         Home Medications    Prior to Admission medications   Medication Sig Start Date End Date Taking? Authorizing Provider  acetaminophen (TYLENOL) 500 MG tablet Take 1,000 mg by mouth every 4 (four) hours as needed for moderate pain.   Yes [provider]  allopurinol (ZYLOPRIM) 100 MG tablet TAKE 1 TABLET BY MOUTH EVERY DAY 11/12/21  Yes Jerrol Banana, MD  colchicine 0.6 MG tablet Take 1 tablet (0.6 mg total) by mouth 2 (two) times daily. 07/09/21  Yes Jerrol Banana, MD  omeprazole (PRILOSEC) 20 MG capsule Take 1 capsule (20 mg total) by mouth daily. 12/30/21  Yes Becky Augusta, NP    Family History Family History  Problem Relation Age of Onset   Multiple sclerosis Mother    Healthy Father    Gout Brother    Heart attack Maternal Grandfather    Alzheimer's disease Paternal Grandmother     Social History Social History   Tobacco Use   Smoking status: Some Days  Types: Cigars   Smokeless tobacco: Never  Vaping Use   Vaping Use: Never used  Substance Use Topics   Alcohol use: Yes    Alcohol/week: 4.0 standard drinks    Types: 4 Standard drinks or equivalent per week   Drug use: Not Currently     Allergies   Amoxicillin   Review of Systems Review of Systems  Constitutional:  Negative for fever.  HENT:  Negative for sore throat.   Respiratory:  Negative for shortness of breath.   Cardiovascular:  Positive for chest pain and palpitations.  Gastrointestinal:  Negative for nausea and vomiting.  Musculoskeletal: Negative.  Negative for myalgias.  Skin:  Negative for rash.  Neurological:  Negative for dizziness, syncope and headaches.  Hematological: Negative.   Psychiatric/Behavioral: Negative.       Physical Exam Triage Vital Signs ED Triage Vitals  Enc Vitals Group     BP 12/30/21 0822 (!) 126/93     Pulse Rate 12/30/21 0822 75     Resp 12/30/21 0822 18     Temp 12/30/21 0822 98.4 F (36.9 C)     Temp Source 12/30/21 0822 Oral     SpO2 12/30/21 0822 100 %     Weight 12/30/21 0821 200 lb (90.7 kg)     Height 12/30/21 0821 5\' 6"  (1.676 m)     Head Circumference --      Peak Flow --      Pain Score 12/30/21 0820 4     Pain Loc --      Pain Edu? --      Excl. in GC? --    No data found.  Updated Vital Signs BP (!) 126/93 (BP Location: Left Arm)   Pulse 75   Temp 98.4 F (36.9 C) (Oral)   Resp 18   Ht 5\' 6"  (1.676 m)   Wt 200 lb (90.7 kg)   SpO2 100%   BMI 32.28 kg/m   Visual Acuity Right Eye Distance:   Left Eye Distance:   Bilateral Distance:    Right Eye Near:   Left Eye Near:    Bilateral Near:     Physical Exam Vitals and nursing note reviewed.  Constitutional:      Appearance: Normal appearance. He is not ill-appearing or diaphoretic.  HENT:     Head: Normocephalic and atraumatic.  Neck:     Vascular: No carotid bruit.  Cardiovascular:     Rate and Rhythm: Normal rate and regular rhythm.     Pulses: Normal pulses.     Heart sounds: Normal heart sounds. No murmur heard.   No friction rub. No gallop.  Pulmonary:     Effort: Pulmonary effort is normal.     Breath sounds: Normal breath sounds. No wheezing, rhonchi or rales.  Chest:     Chest wall: No tenderness.  Musculoskeletal:     Cervical back: Normal range of motion and neck supple.  Skin:    General: Skin is warm and dry.     Capillary Refill: Capillary refill takes less than 2 seconds.     Findings: No erythema or rash.  Neurological:     General: No focal deficit present.     Mental Status: He is alert and oriented to person, place, and time.  Psychiatric:        Mood and Affect: Mood normal.        Behavior: Behavior normal.        Thought Content: Thought content normal.  Judgment: Judgment normal.     UC Treatments / Results  Labs (all labs ordered are listed, but only abnormal results are displayed) Labs Reviewed  COMPREHENSIVE METABOLIC PANEL - Abnormal; Notable for the following components:      Result Value   Glucose, Bld 104 (*)    Total Protein 8.2 (*)    ALT 51 (*)    All other components within normal limits  CBC WITH DIFFERENTIAL/PLATELET  TROPONIN I (HIGH SENSITIVITY)    EKG Normal sinus rhythm with ventricular rate of 64 bpm PR interval 140 ms QRS duration 94 ms QT/QTc 406/418 ms No ST elevation or T wave abnormalities appreciated on No additional tracings for comparison available in epic   Radiology DG Chest 2 View  Result Date: 12/30/2021 CLINICAL DATA:  Chest pain EXAM: CHEST - 2 VIEW COMPARISON:  None. FINDINGS: The heart size and mediastinal contours are within normal limits. Both lungs are clear. The visualized skeletal structures are unremarkable. IMPRESSION: No active cardiopulmonary disease. Electronically Signed   By: Larose Hires D.O.   On: 12/30/2021 09:11    Procedures Procedures (including critical care time)  Medications Ordered in UC Medications  alum & mag hydroxide-simeth (MAALOX/MYLANTA) 200-200-20 MG/5ML suspension 30 mL (30 mLs Oral Given 12/30/21 0843)    And  lidocaine (XYLOCAINE) 2 % viscous mouth solution 15 mL (15 mLs Oral Given 12/30/21 0844)    Initial Impression / Assessment and Plan / UC Course  I have reviewed the triage vital signs and the nursing notes.  Pertinent labs & imaging results that were available during my care of the patient were reviewed by me and considered in my medical decision making (see chart for details).  Patient is a nontoxic appearing 36 year old male here for evaluation of 5 to 6 days of constant dull substernal chest pain that does not radiate to the shoulder, neck, or back.  Is not associated with sweating, shortness of breath, dizziness, or headaches.  The pain  improves with movement but is not reproducible to palpation of the chest wall.  Patient does have a history of gout multiple sites, generalized anxiety disorder, hyperlipidemia, and IBS.  Patient also has a history of alcohol use disorder that is indicated is being mild.  On exam patient has a benign cardiopulmonary exam with S1-S2 heart sounds that are regular rate and rhythm without murmur, rub, or gallop.  Lungs are clear to auscultation all fields.  No bruits appreciated when auscultating the carotid arteries bilaterally.  As mentioned above, there is no reproduction of the pain when palpating along the costosternal joints bilaterally along the length of the sternum.  No pain with palpation of the left or right anterior chest wall.  Etiology of the patient's chest pain is unclear but given that it is a constant, dull, substernal chest pain that is been going on for 5 to 6 days we will check EKG, CBC, CMP, troponin, and chest x-ray.  We will also order GI cocktail to see if it helps with symptoms.  EKG shows normal sinus rhythm without any ST or T wave abnormalities.  There are no other tracings available for comparison in epic.  CBC is unremarkable.  H&H is 15.1 and 42.9, platelets 222.  CMP has a mildly elevated glucose of 104, mildly elevated total protein of 8.2, and mildly elevated AST of 51.  Sodium, potassium, and chloride are all normal.  As his renal function and remainder of the transaminases.  High-sensitivity troponin is 3.  Chest  x-ray independently reviewed and evaluated by me.  Impression: There is mild blunting of the left costophrenic angle but no definitive effusion or infiltrate noted.  Radiology overread is pending. Radiology impression states heart size and mediastinal contours are within normal limits.  Both lungs are clear.  The visualized skeletal structures are unremarkable.  No active cardiopulmonary disease.  Patient reports that his symptoms improved with a GI cocktail.   Given the positive evidence of cardiopulmonary involvement I suspect that patient may have some mild esophagitis that is causing his discomfort.  I will do a trial of a PPI with omeprazole 20 mg daily at bedtime for 4 weeks to see if makes improvement of his symptoms.  I have recommended that he follow-up with his PCP, Dr. [name], for continued monitoring of the symptoms and whether or not the PPI makes any improvement.  I have also suggested that he make an appoint with GI to discuss his symptoms with them and talk about a possible endoscopy.   Final Clinical Impressions(s) / UC Diagnoses   Final diagnoses:  Acute esophagitis     Discharge Instructions      Take the omeprazole at bedtime daily.  Avoid eating greasy, spicy, or acidic foods as this may intensify your symptoms.  Avoid eating within 4 hours of bedtime.  You can also use over-the-counter Pepcid 20 mg twice daily to help with your symptoms.  If your symptoms do not improve you need to see your PCP for a referral to gastroenterology.      ED Prescriptions     Medication Sig Dispense Auth. Provider   omeprazole (PRILOSEC) 20 MG capsule Take 1 capsule (20 mg total) by mouth daily. 30 capsule Becky Augusta, NP      PDMP not reviewed this encounter.   Becky Augusta, NP 12/30/21 [phone]

## 2021-12-30 NOTE — ED Triage Notes (Signed)
Pt c/o chest pain x5-6 days ? ?Pt states that it feels like a panic attack. Its a dull ache in the center of chest.  ? ?Pt denies any SOB, Dizziness, Headaches, lightheadedness.  ? ?Pt played soccer on Monday and Wednesday night. Pt states that he feels no pain when playing sports, just at rest.  ? ?Pt states that it is better when laying down and pt has no issues sleeping.  ?

## 2022-01-26 IMAGING — CR DG ABDOMEN 1V
2 series · 3 of 3 positions shown · non-contrast
Comparison: None.

CLINICAL DATA: Assess stones.

EXAM:
ABDOMEN - 1 VIEW

[abdomen kub (1 of 2)]
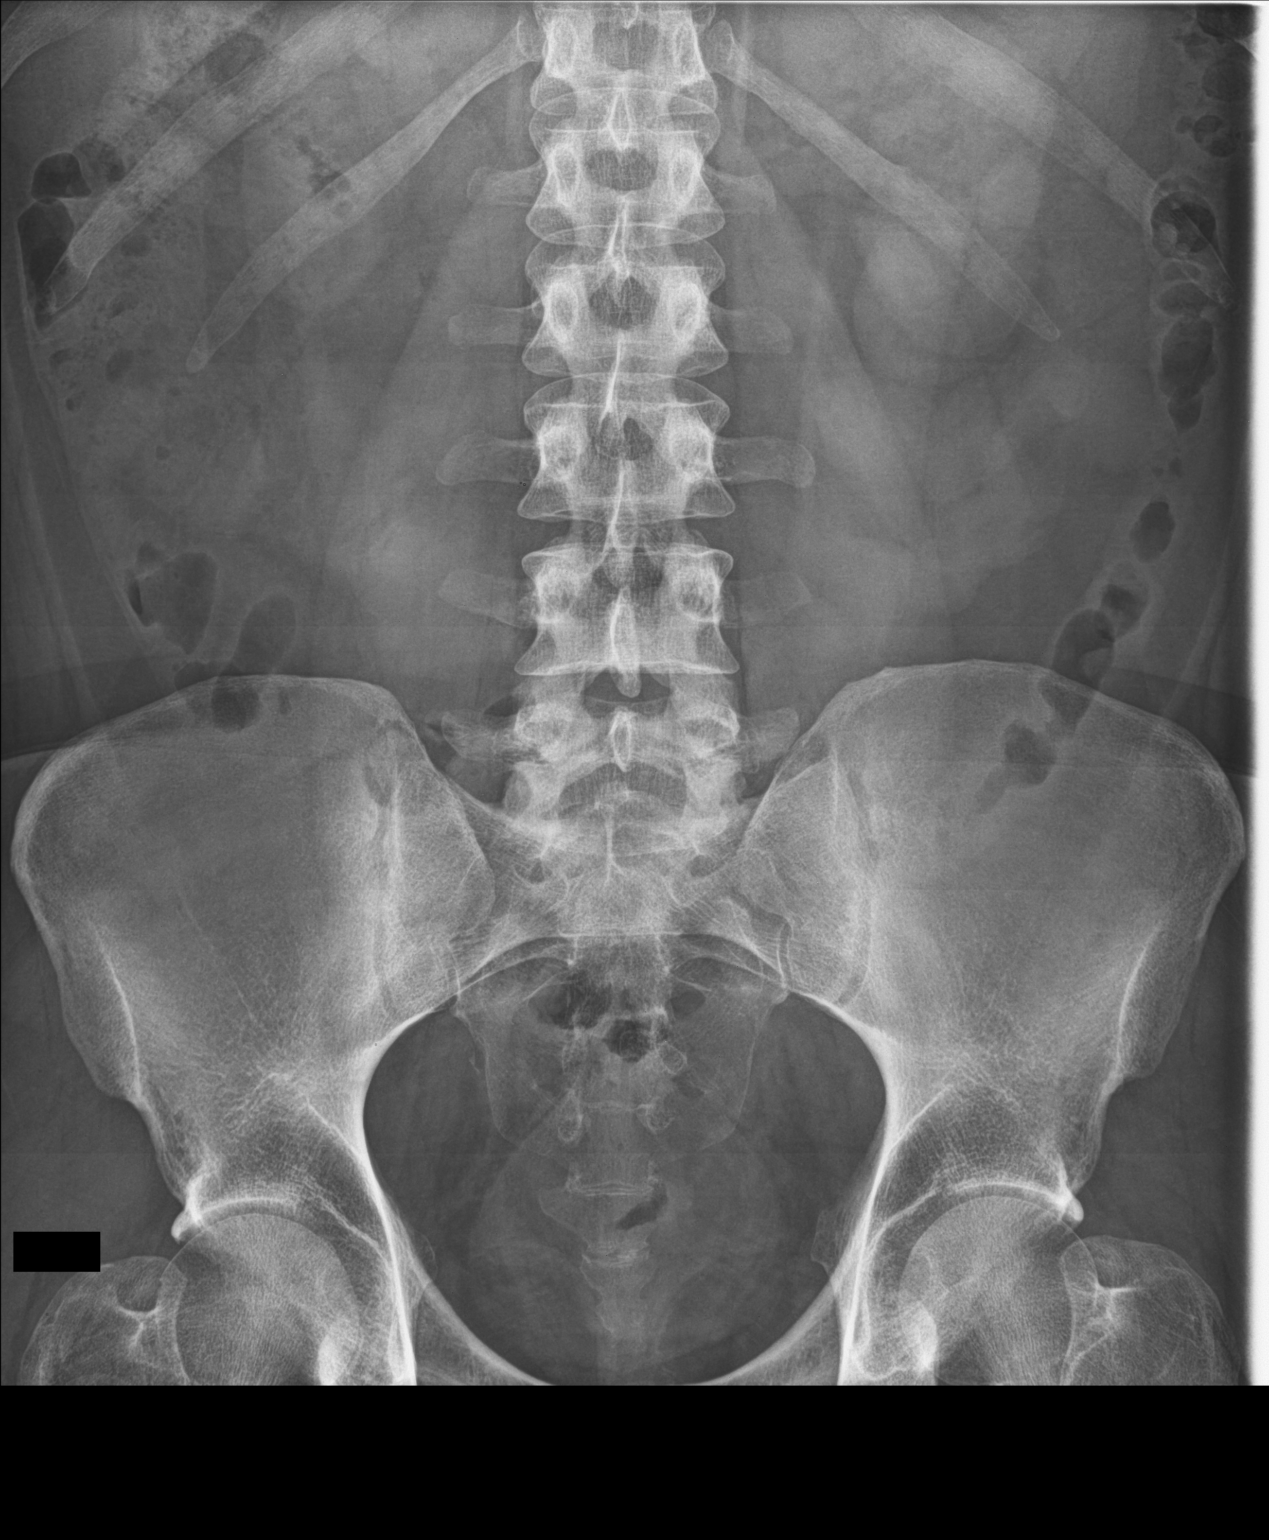

[Series 2: abdomen kub · 0.14mm/px · 2 of 2 slices shown (2 of 2)]
[im 1/2]
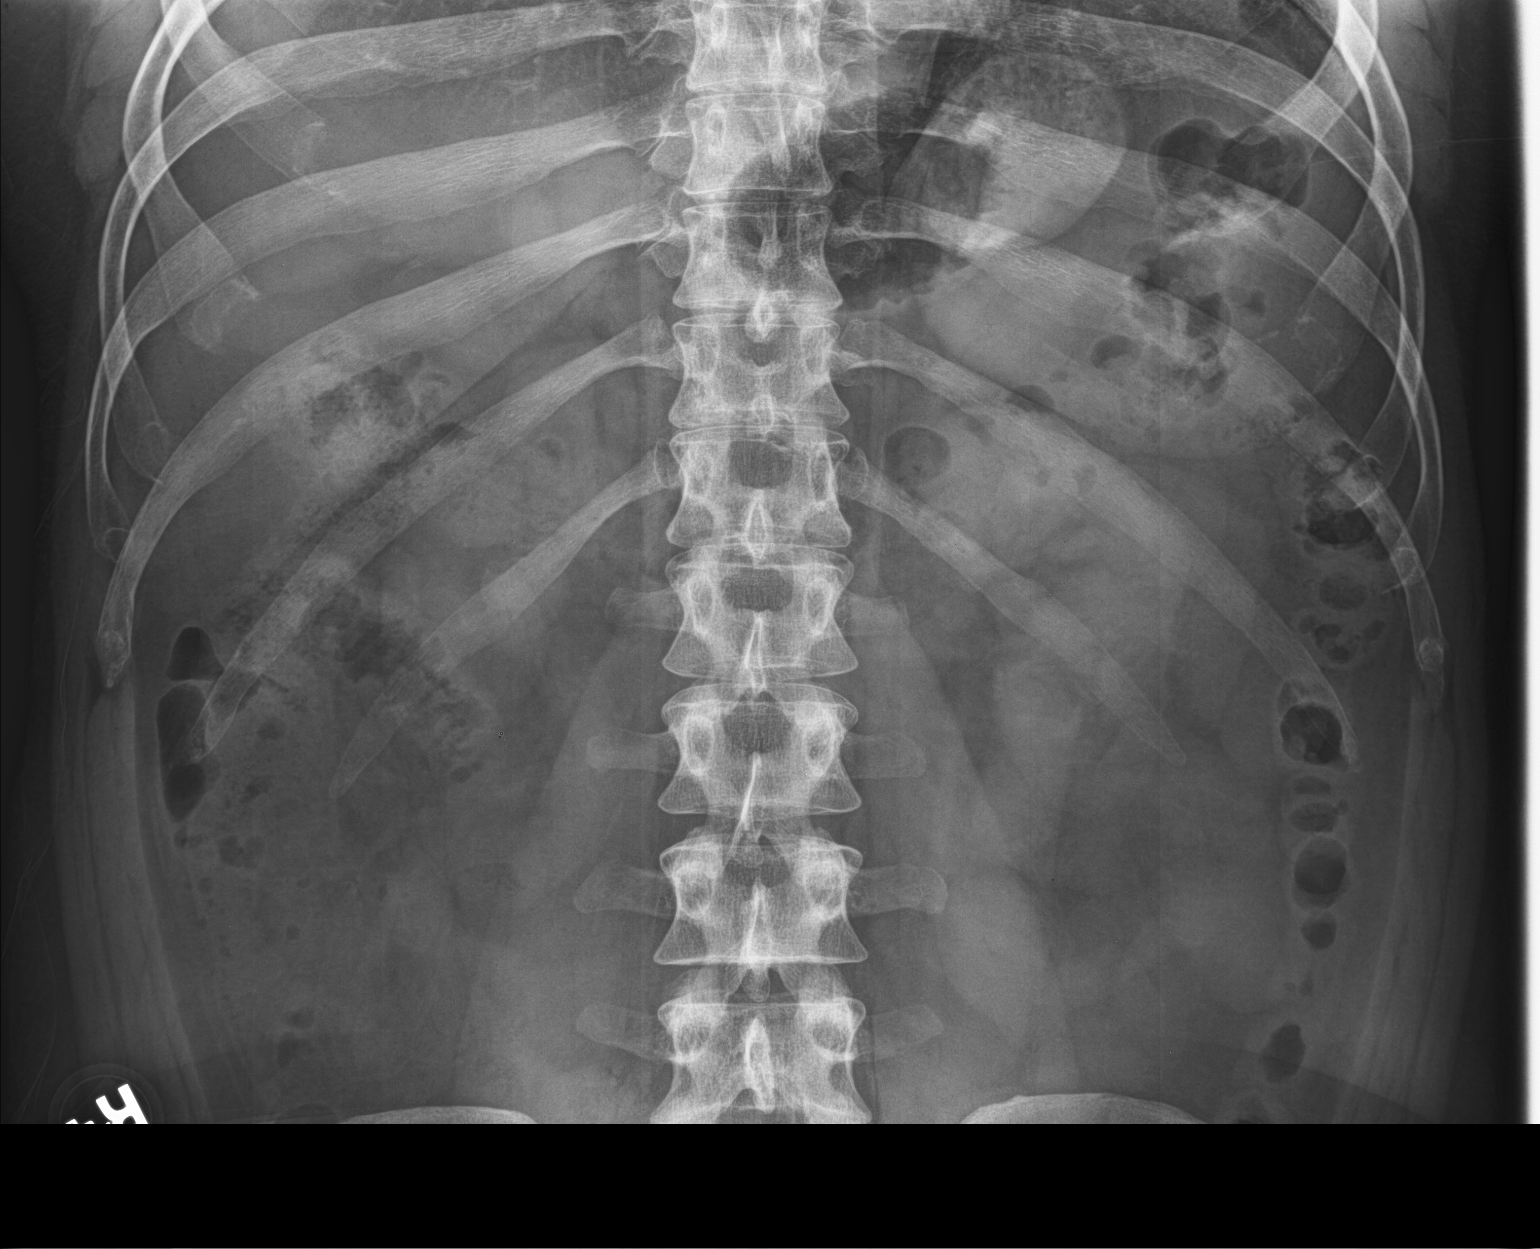
[im 2/2]
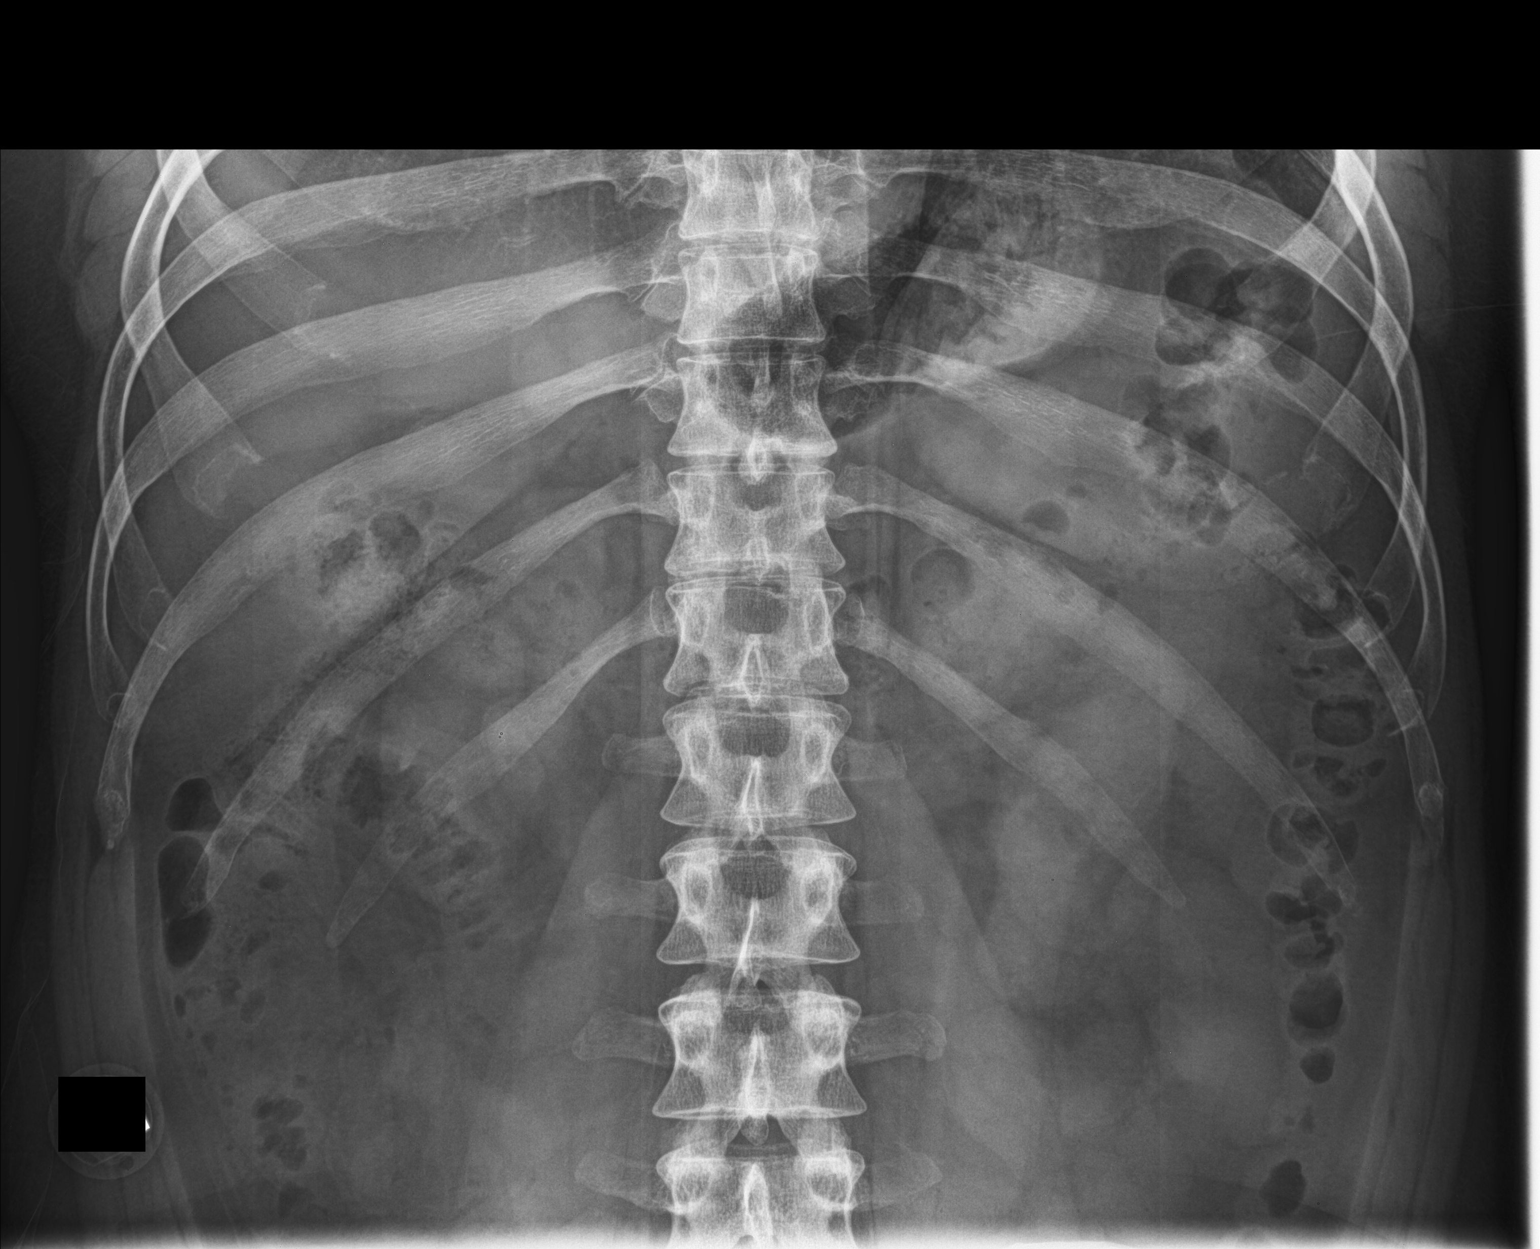

[3 of 3 positions shown; findings below may reference images not displayed]

FINDINGS: The bowel gas pattern is normal. No definitive radio-opaque calculi
or other significant radiographic abnormality are seen.
IMPRESSION: No definitive radiopaque foci project over the contours of the
kidneys. If persistent concern, recommend dedicated evaluation with
noncontrast CT.

## 2022-02-28 ENCOUNTER — Other Ambulatory Visit: Payer: Self-pay | Admitting: Family Medicine

## 2022-02-28 DIAGNOSIS — M1A09X0 Idiopathic chronic gout, multiple sites, without tophus (tophi): Secondary | ICD-10-CM

## 2022-03-01 NOTE — Telephone Encounter (Signed)
Requested Prescriptions  ?Pending Prescriptions Disp Refills  ?? allopurinol (ZYLOPRIM) 100 MG tablet [Pharmacy Med Name: ALLOPURINOL 100 MG TABLET] 90 tablet 0  ?  Sig: TAKE 1 TABLET BY MOUTH EVERY DAY  ?  ? Endocrinology:  Gout Agents - allopurinol Passed - 02/28/2022  2:38 AM  ?  ?  Passed - Uric Acid in normal range and within 360 days  ?  Uric Acid  ?Date Value Ref Range Status  ?07/22/2021 6.4 3.8 - 8.4 mg/dL Final  ?  Comment:  ?             Therapeutic target for gout patients: <6.0  ?   ?  ?  Passed - Cr in normal range and within 360 days  ?  Creatinine, Ser  ?Date Value Ref Range Status  ?12/30/2021 0.98 0.61 - 1.24 mg/dL Final  ?   ?  ?  Passed - Valid encounter within last 12 months  ?  Recent Outpatient Visits   ?      ? 7 months ago Need for immunization against influenza  ? [facility] Jerrol Banana, MD  ? 8 months ago Annual physical exam  ? [facility] Jerrol Banana, MD  ? 9 months ago Chronic gout of multiple sites, unspecified cause  ? [facility] Jerrol Banana, MD  ?  ?  ?Future Appointments   ?        ? In 3 months [name] [name], MD [facility]  ?  ? ?  ?  ?  Passed - CBC within normal limits and completed in the last 12 months  ?  WBC  ?Date Value Ref Range Status  ?12/30/2021 6.1 4.0 - 10.5 K/uL Final  ? ?RBC  ?Date Value Ref Range Status  ?12/30/2021 5.01 4.22 - 5.81 MIL/uL Final  ? ?Hemoglobin  ?Date Value Ref Range Status  ?12/30/2021 15.1 13.0 - 17.0 g/dL Final  ?31/54/0086 76.1 13.0 - 17.7 g/dL Final  ? ?HCT  ?Date Value Ref Range Status  ?12/30/2021 42.9 39.0 - 52.0 % Final  ? ?Hematocrit  ?Date Value Ref Range Status  ?06/10/2021 43.9 37.5 - 51.0 % Final  ? ?MCHC  ?Date Value Ref Range Status  ?12/30/2021 35.2 30.0 - 36.0 g/dL Final  ? ?MCH  ?Date Value Ref Range Status  ?12/30/2021 30.1 26.0 - 34.0 pg Final  ? ?MCV  ?Date Value Ref Range Status  ?12/30/2021 85.6 80.0 - 100.0 fL Final  ?06/10/2021 89 79 - 97 fL Final  ? ?No  results found for: PLTCOUNTKUC, LABPLAT, POCPLA ?RDW  ?Date Value Ref Range Status  ?12/30/2021 12.1 11.5 - 15.5 % Final  ?06/10/2021 11.7 11.6 - 15.4 % Final  ? ?  ?  ?  ? ? ?

## 2022-06-03 ENCOUNTER — Other Ambulatory Visit: Payer: Self-pay | Admitting: Family Medicine

## 2022-06-03 DIAGNOSIS — M1A09X0 Idiopathic chronic gout, multiple sites, without tophus (tophi): Secondary | ICD-10-CM

## 2022-06-03 NOTE — Telephone Encounter (Signed)
Requested Prescriptions  Pending Prescriptions Disp Refills  . allopurinol (ZYLOPRIM) 100 MG tablet [Pharmacy Med Name: ALLOPURINOL 100 MG TABLET] 90 tablet 0    Sig: TAKE 1 TABLET BY MOUTH EVERY DAY     Endocrinology:  Gout Agents - allopurinol Passed - 06/03/2022  2:21 AM      Passed - Uric Acid in normal range and within 360 days    Uric Acid  Date Value Ref Range Status  07/22/2021 6.4 3.8 - 8.4 mg/dL Final    Comment:               Therapeutic target for gout patients: <6.0         Passed - Cr in normal range and within 360 days    Creatinine, Ser  Date Value Ref Range Status  12/30/2021 0.98 0.61 - 1.24 mg/dL Final         Passed - Valid encounter within last 12 months    Recent Outpatient Visits          10 months ago Need for immunization against influenza   [name], [name], MD   11 months ago Annual physical exam   [place] [facility], [name], MD   1 year ago Chronic gout of multiple sites, unspecified cause   [place] [name], [name], MD      Future Appointments            In 1 week [name], [name], MD [facility]           Passed - CBC within normal limits and completed in the last 12 months    WBC  Date Value Ref Range Status  12/30/2021 6.1 4.0 - 10.5 K/uL Final   RBC  Date Value Ref Range Status  12/30/2021 5.01 4.22 - 5.81 MIL/uL Final   Hemoglobin  Date Value Ref Range Status  12/30/2021 15.1 13.0 - 17.0 g/dL Final  48/54/6270 35.0 13.0 - 17.7 g/dL Final   HCT  Date Value Ref Range Status  12/30/2021 42.9 39.0 - 52.0 % Final   Hematocrit  Date Value Ref Range Status  06/10/2021 43.9 37.5 - 51.0 % Final   MCHC  Date Value Ref Range Status  12/30/2021 35.2 30.0 - 36.0 g/dL Final   [facility]  Date Value Ref  Range Status  12/30/2021 30.1 26.0 - 34.0 pg Final   MCV  Date Value Ref Range Status  12/30/2021 85.6 80.0 - 100.0 fL Final  06/10/2021 89 79 - 97 fL Final   No results found for: "PLTCOUNTKUC", "LABPLAT", "POCPLA" RDW  Date Value Ref Range Status  12/30/2021 12.1 11.5 - 15.5 % Final  06/10/2021 11.7 11.6 - 15.4 % Final

## 2022-06-13 ENCOUNTER — Ambulatory Visit (INDEPENDENT_AMBULATORY_CARE_PROVIDER_SITE_OTHER): Payer: BC Managed Care – PPO | Admitting: Family Medicine

## 2022-06-13 ENCOUNTER — Encounter: Payer: Self-pay | Admitting: Family Medicine

## 2022-06-13 VITALS — BP 134/86 | HR 88 | Ht 66.0 in | Wt 182.0 lb

## 2022-06-13 DIAGNOSIS — Z13220 Encounter for screening for lipoid disorders: Secondary | ICD-10-CM | POA: Diagnosis not present

## 2022-06-13 DIAGNOSIS — M1A09X0 Idiopathic chronic gout, multiple sites, without tophus (tophi): Secondary | ICD-10-CM

## 2022-06-13 DIAGNOSIS — R7989 Other specified abnormal findings of blood chemistry: Secondary | ICD-10-CM

## 2022-06-13 DIAGNOSIS — Z0000 Encounter for general adult medical examination without abnormal findings: Secondary | ICD-10-CM

## 2022-06-13 DIAGNOSIS — Z23 Encounter for immunization: Secondary | ICD-10-CM

## 2022-06-13 DIAGNOSIS — K589 Irritable bowel syndrome without diarrhea: Secondary | ICD-10-CM

## 2022-06-13 DIAGNOSIS — E782 Mixed hyperlipidemia: Secondary | ICD-10-CM

## 2022-06-13 DIAGNOSIS — F411 Generalized anxiety disorder: Secondary | ICD-10-CM

## 2022-06-13 NOTE — Patient Instructions (Signed)
-   Obtain fasting labs with orders provided (can have water or black coffee but otherwise no food or drink x 8 hours before labs) °- Review information provided °- Attend eye doctor annually, dentist every 6 months, work towards or maintain 30 minutes of moderate intensity physical activity at least 5 days per week, and consume a balanced diet °- Return in 1 year for physical °- Contact us for any questions between now and then °

## 2022-06-13 NOTE — Assessment & Plan Note (Signed)
Annual examination completed, risk stratification labs ordered, anticipatory guidance provided.  We will follow labs once resulted. 

## 2022-06-13 NOTE — Assessment & Plan Note (Signed)
Increased life / work stressors, managing as best he can. Will present non-pharmacologic means to trial but he was advised that if symptoms persist, to reach out for medication management.

## 2022-06-13 NOTE — Progress Notes (Signed)
Annual Physical Exam Visit  Patient Information:  Patient ID: Timothy Lawrence, male DOB: 08/20/1986 Age: 36 y.o. MRN: [id number]   Subjective:   CC: Annual Physical Exam  HPI:  Timothy Lawrence is here for their annual physical.  I reviewed the past medical history, family history, social history, surgical history, and allergies today and changes were made as necessary.  Please see the problem list section below for additional details.  Past Medical History: Past Medical History:  Diagnosis Date   Anxiety    Depression    Effusion, left knee 05/27/2021   Gout    Hyperlipidemia    Past Surgical History: Past Surgical History:  Procedure Laterality Date   TESTICLE TORSION REDUCTION     WISDOM TOOTH EXTRACTION     Family History: Family History  Problem Relation Age of Onset   Multiple sclerosis Mother    Healthy Father    Gout Brother    Heart attack Maternal Grandfather    Alzheimer's disease Paternal Grandmother    Allergies: Allergies  Allergen Reactions   Amoxicillin Hives   Health Maintenance: Health Maintenance  Topic Date Due   HIV Screening  Never done   Hepatitis C Screening  Never done   TETANUS/TDAP  09/02/2025   INFLUENZA VACCINE  Completed   COVID-19 Vaccine  Completed   HPV VACCINES  Aged Out    HM Colonoscopy     This patient has no relevant Health Maintenance data.      Medications: Current Outpatient Medications on File Prior to Visit  Medication Sig Dispense Refill   acetaminophen (TYLENOL) 500 MG tablet Take 1,000 mg by mouth every 4 (four) hours as needed for moderate pain.     allopurinol (ZYLOPRIM) 100 MG tablet TAKE 1 TABLET BY MOUTH EVERY DAY 90 tablet 0   No current facility-administered medications on file prior to visit.    Review of Systems: No headache, visual changes, nausea, vomiting, diarrhea, constipation, dizziness, abdominal pain, skin rash, fevers, chills, night sweats, swollen lymph nodes, weight loss, chest pain, body  aches, joint swelling, muscle aches, shortness of breath, mood changes, visual or auditory hallucinations reported.  Objective:   Vitals:   06/13/22 0816  BP: 134/86  Pulse: 88  SpO2: 98%   Vitals:   06/13/22 0816  Weight: 182 lb (82.6 kg)  Height: 5\' 6"  (1.676 m)   Body mass index is 29.38 kg/m.  General: Well Developed, well nourished, and in no acute distress.  Neuro: Alert and oriented x3, extra-ocular muscles intact, sensation grossly intact. Cranial nerves II through XII are grossly intact, motor, sensory, and coordinative functions are intact. HEENT: Normocephalic, atraumatic, pupils equal round reactive to light, neck supple, no masses, no lymphadenopathy, thyroid nonpalpable. Oropharynx, nasopharynx, external ear canals are unremarkable. Skin: Warm and dry, no rashes noted.  Cardiac: Regular rate and rhythm, no murmurs rubs or gallops. No peripheral edema. Pulses symmetric. Respiratory: Clear to auscultation bilaterally. Not using accessory muscles, speaking in full sentences.  Abdominal: Soft, nontender, nondistended, positive bowel sounds, no masses, no organomegaly. Musculoskeletal: Shoulder, elbow, wrist, hip, knee, ankle stable, and with full range of motion.   Impression and Recommendations:   The patient was counselled, risk factors were discussed, and anticipatory guidance given.  Problem List Items Addressed This Visit       Digestive   IBS (irritable bowel syndrome)    Symptoms well-controlled, no complaints.        Musculoskeletal and Integument   Chronic gout of  multiple sites    Has been very well controlled overall. In June, had a transiet diet change which caused him to be symptomatic. Resolved after 1 day with ibuprofen and dietary modifications. Tolerating allopurinol well.      Relevant Orders   Uric acid     Other   GAD (generalized anxiety disorder)    Increased life / work stressors, managing as best he can. Will present  non-pharmacologic means to trial but he was advised that if symptoms persist, to reach out for medication management.      Hyperlipidemia, mixed    Lab recheck ordered.      Relevant Orders   Comprehensive metabolic panel   Lipid panel   Apo A1 + B + Ratio   Annual physical exam - Primary    Annual examination completed, risk stratification labs ordered, anticipatory guidance provided.  We will follow labs once resulted.      Relevant Orders   CBC   Comprehensive metabolic panel   Lipid panel   TSH   VITAMIN D 25 Hydroxy (Vit-D Deficiency, Fractures)   Hepatitis C antibody   HIV Antibody (routine testing w rflx)   Uric acid   Apo A1 + B + Ratio   Other Visit Diagnoses     Need for immunization against influenza       Relevant Orders   Flu Vaccine QUAD 18mo+IM (Fluarix, Fluzone & Alfiuria Quad PF) (Completed)   Screening for lipoid disorders       Relevant Orders   Comprehensive metabolic panel   Lipid panel   Apo A1 + B + Ratio   Low serum vitamin D       Relevant Orders   VITAMIN D 25 Hydroxy (Vit-D Deficiency, Fractures)        Orders & Medications Medications: No orders of the defined types were placed in this encounter.  Orders Placed This Encounter  Procedures   Flu Vaccine QUAD 52mo+IM (Fluarix, Fluzone & Alfiuria Quad PF)   CBC   Comprehensive metabolic panel   Lipid panel   TSH   VITAMIN D 25 Hydroxy (Vit-D Deficiency, Fractures)   Hepatitis C antibody   HIV Antibody (routine testing w rflx)   Uric acid   Apo A1 + B + Ratio     Return in about 1 year (around 06/14/2023) for physical.    Jerrol Banana, MD   Primary Care Sports Medicine [name]

## 2022-06-13 NOTE — Assessment & Plan Note (Addendum)
Has been very well controlled overall. In June, had a transiet diet change which caused him to be symptomatic. Resolved after 1 day with ibuprofen and dietary modifications. Tolerating allopurinol well.

## 2022-06-13 NOTE — Assessment & Plan Note (Signed)
Symptoms well-controlled, no complaints.

## 2022-06-13 NOTE — Assessment & Plan Note (Signed)
Lab recheck ordered. 

## 2022-08-17 IMAGING — CR DG CHEST 2V
3 series · 3 of 3 positions shown · non-contrast
Comparison: None.

CLINICAL DATA: Chest pain

EXAM:
CHEST - 2 VIEW

[chest pa (1 of 2)]
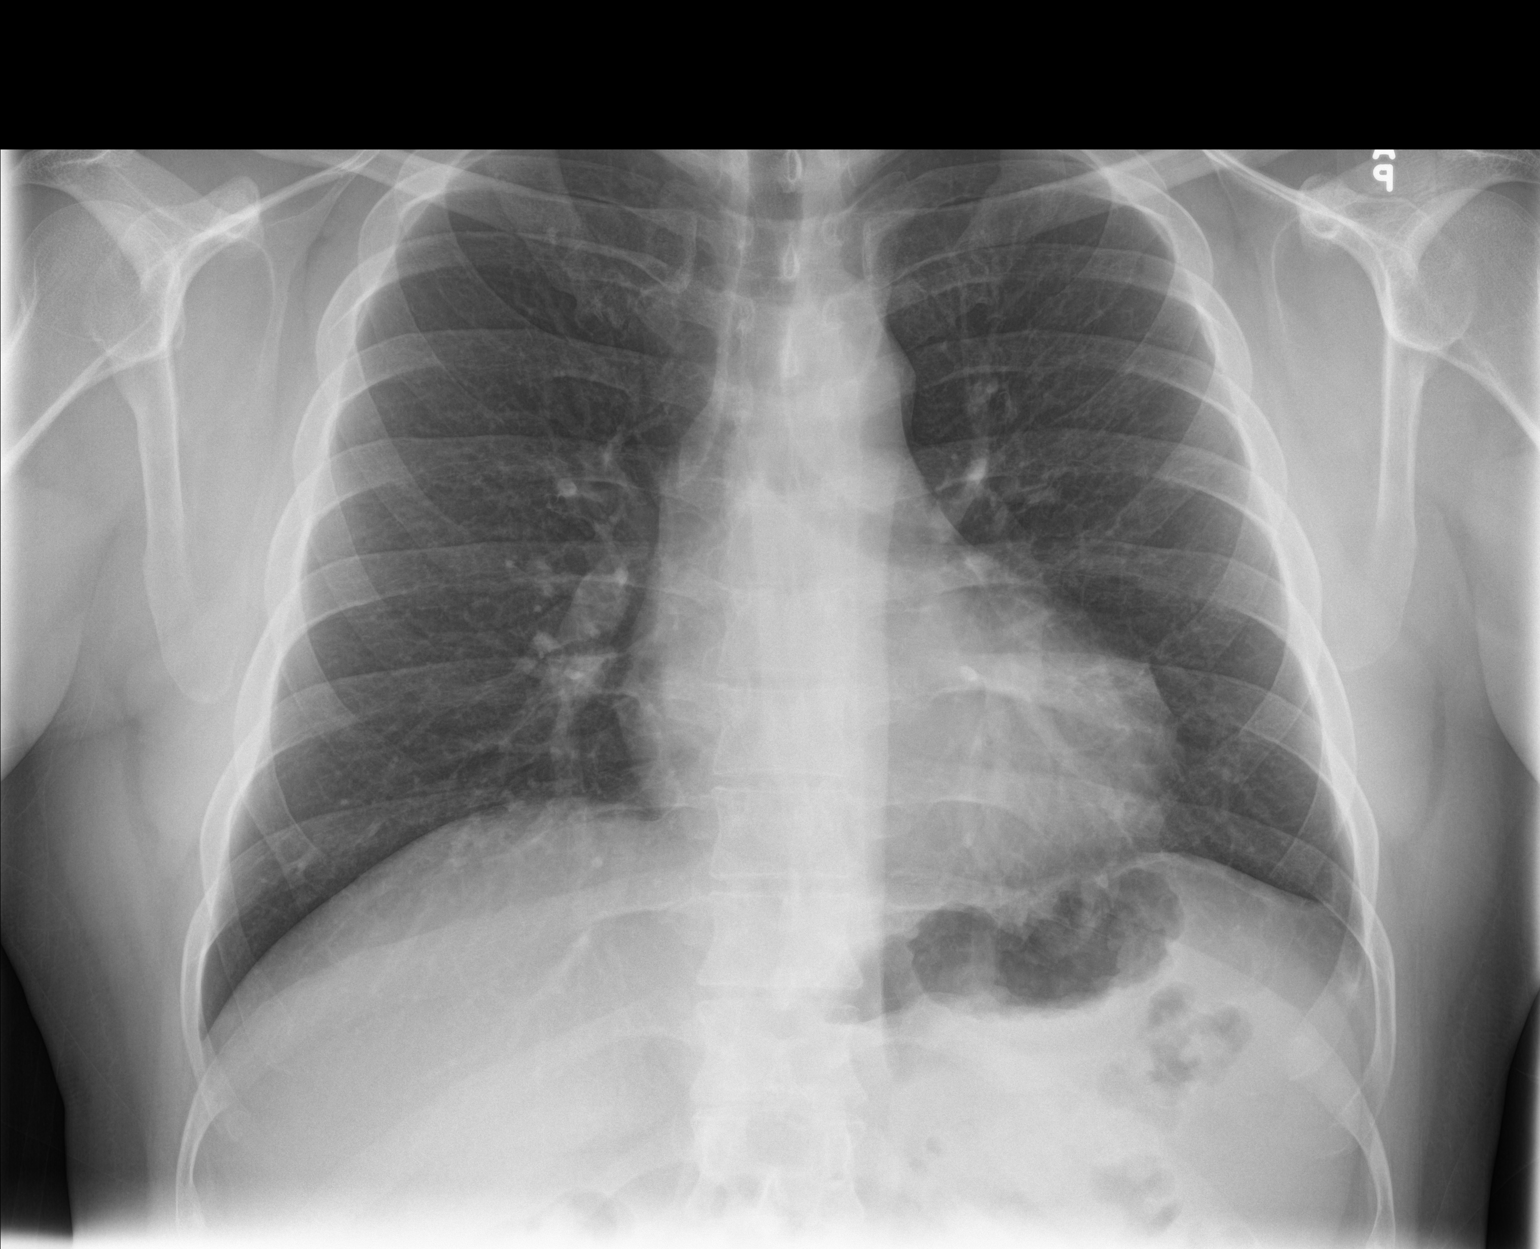

[chest pa (2 of 2)]
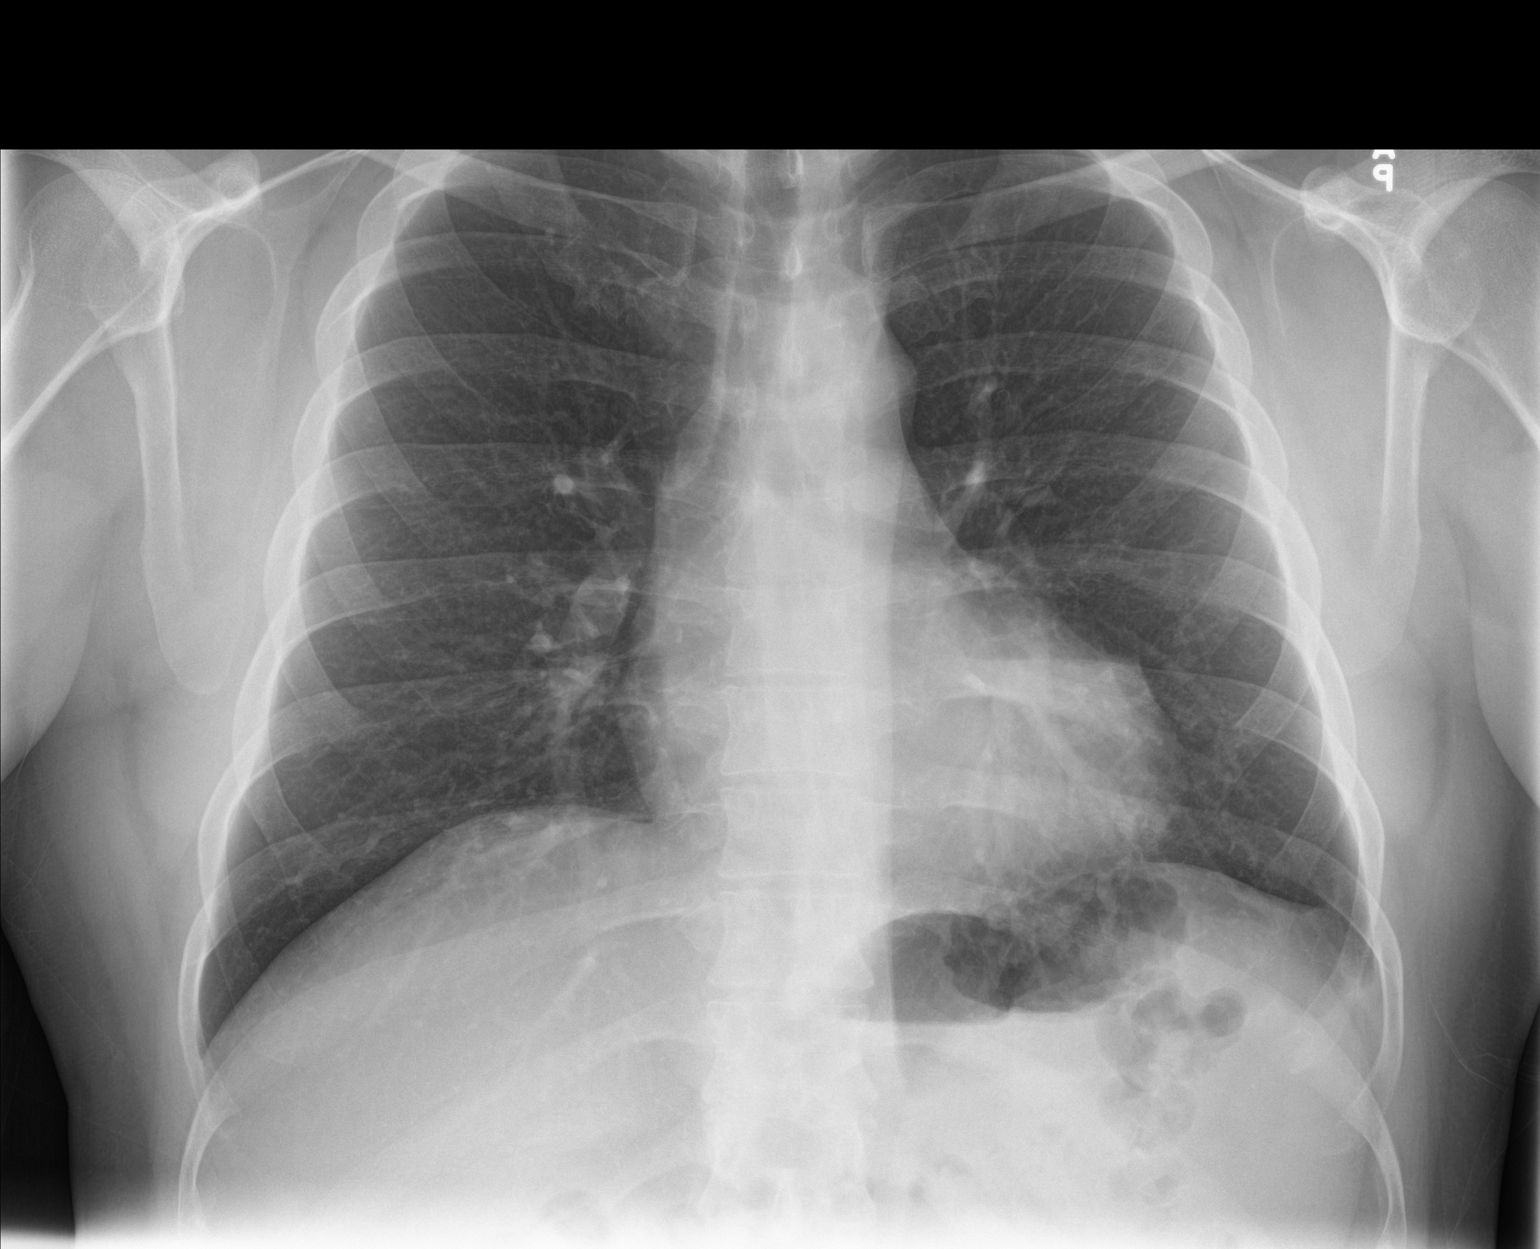

[chest lat]
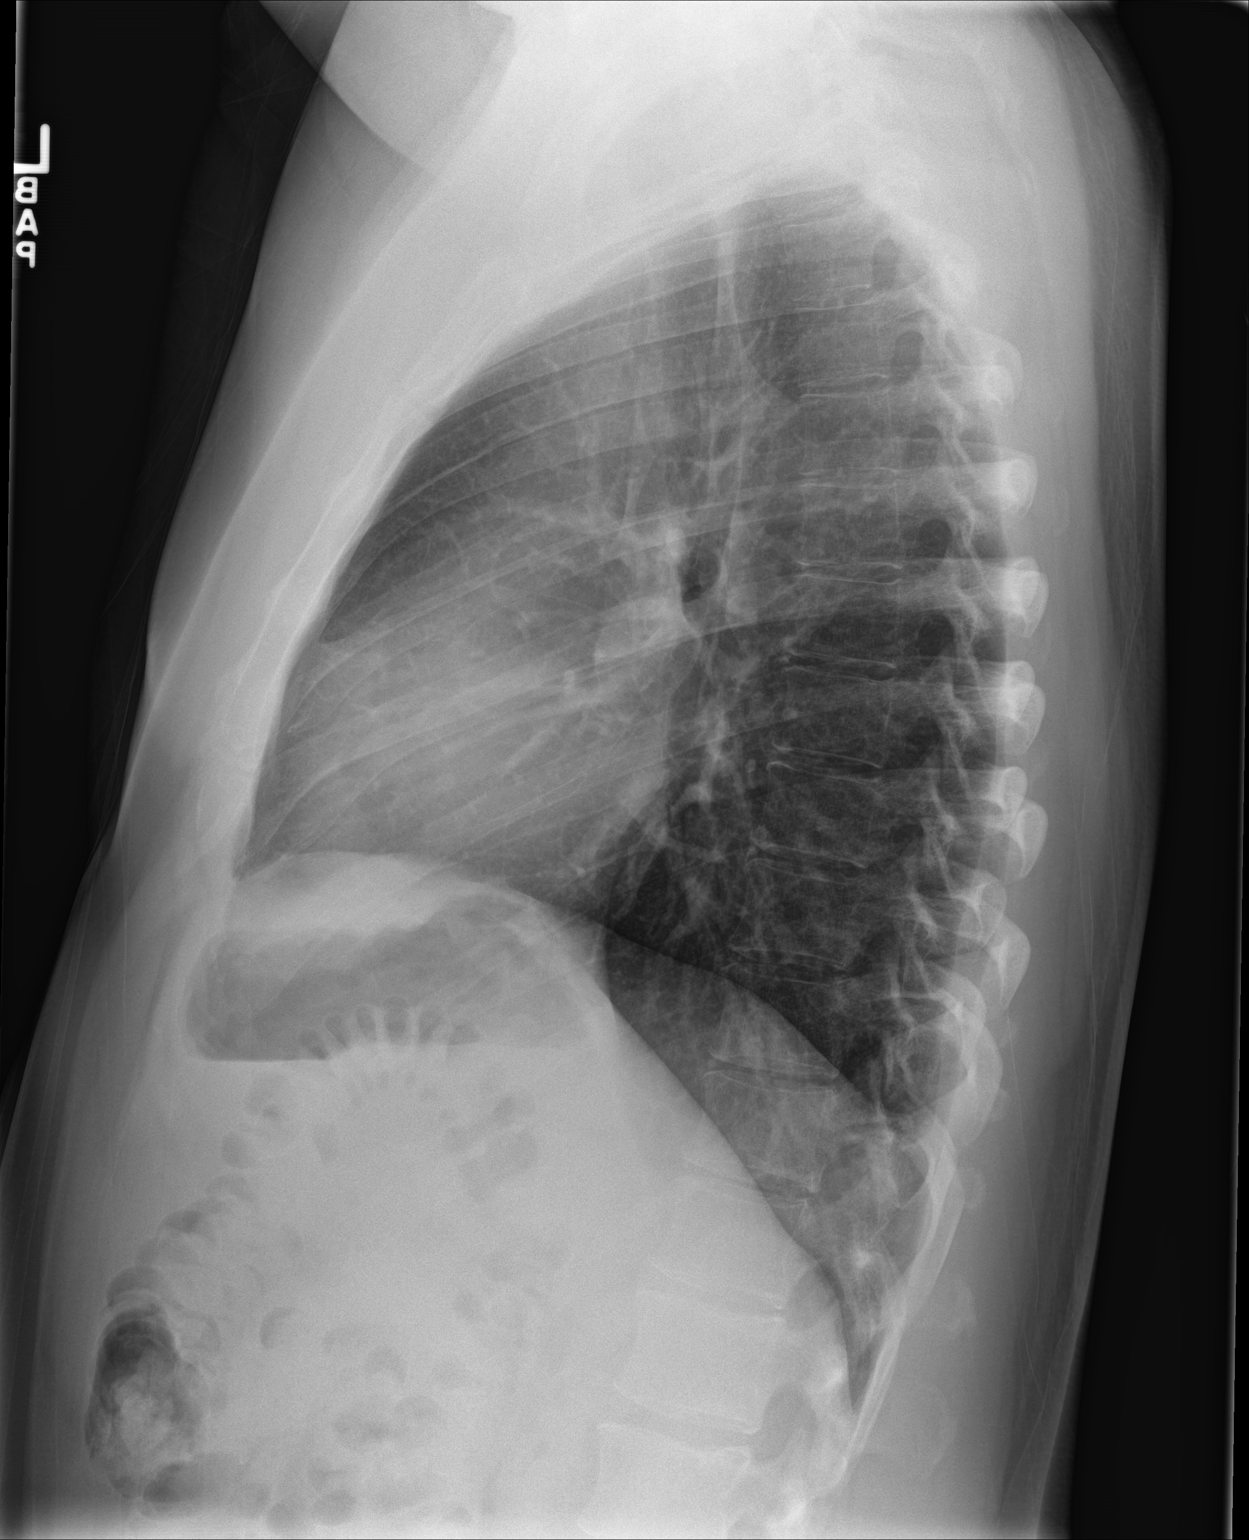

[3 of 3 positions shown; findings below may reference images not displayed]

FINDINGS: The heart size and mediastinal contours are within normal limits.
Both lungs are clear. The visualized skeletal structures are
unremarkable.
IMPRESSION: No active cardiopulmonary disease.

## 2022-09-05 ENCOUNTER — Other Ambulatory Visit: Payer: Self-pay | Admitting: Family Medicine

## 2022-09-05 DIAGNOSIS — M1A09X0 Idiopathic chronic gout, multiple sites, without tophus (tophi): Secondary | ICD-10-CM

## 2022-09-05 NOTE — Telephone Encounter (Signed)
Requested Prescriptions  Pending Prescriptions Disp Refills   allopurinol (ZYLOPRIM) 100 MG tablet [Pharmacy Med Name: ALLOPURINOL 100 MG TABLET] 90 tablet 3    Sig: TAKE 1 TABLET BY MOUTH EVERY DAY     Endocrinology:  Gout Agents - allopurinol Failed - 09/05/2022  2:10 AM      Failed - Uric Acid in normal range and within 360 days    Uric Acid  Date Value Ref Range Status  07/22/2021 6.4 3.8 - 8.4 mg/dL Final    Comment:               Therapeutic target for gout patients: <6.0         Passed - Cr in normal range and within 360 days    Creatinine, Ser  Date Value Ref Range Status  12/30/2021 0.98 0.61 - 1.24 mg/dL Final         Passed - Valid encounter within last 12 months    Recent Outpatient Visits           2 months ago Annual physical exam   [place] [name] [name], MD   1 year ago Need for immunization against influenza   [place] [name], [name], MD   1 year ago Annual physical exam   [place] [name], [name], MD   1 year ago Chronic gout of multiple sites, unspecified cause    [facility] and [facility] Medicine at [name], [name], MD       Future Appointments             In 9 months [name], [name], MD [facility] and [facility] Medicine at [facility], [facility]            Passed - CBC within normal limits and completed in the last 12 months    WBC  Date Value Ref Range Status  12/30/2021 6.1 4.0 - 10.5 K/uL Final   RBC  Date Value Ref Range Status  12/30/2021 5.01 4.22 - 5.81 MIL/uL Final   Hemoglobin  Date Value Ref Range Status  12/30/2021 15.1 13.0 - 17.0 g/dL Final  17/61/6073 71.0 13.0 - 17.7 g/dL Final   HCT  Date Value Ref Range Status  12/30/2021 42.9 39.0 - 52.0 % Final   Hematocrit  Date Value Ref Range Status  06/10/2021  43.9 37.5 - 51.0 % Final   MCHC  Date Value Ref Range Status  12/30/2021 35.2 30.0 - 36.0 g/dL Final   [facility]  Date Value Ref Range Status  12/30/2021 30.1 26.0 - 34.0 pg Final   MCV  Date Value Ref Range Status  12/30/2021 85.6 80.0 - 100.0 fL Final  06/10/2021 89 79 - 97 fL Final   No results found for: "PLTCOUNTKUC", "LABPLAT", "POCPLA" RDW  Date Value Ref Range Status  12/30/2021 12.1 11.5 - 15.5 % Final  06/10/2021 11.7 11.6 - 15.4 % Final

## 2022-12-11 ENCOUNTER — Encounter: Payer: Self-pay | Admitting: Family Medicine

## 2022-12-13 ENCOUNTER — Encounter: Payer: Self-pay | Admitting: Family Medicine

## 2022-12-13 ENCOUNTER — Ambulatory Visit (INDEPENDENT_AMBULATORY_CARE_PROVIDER_SITE_OTHER): Payer: BC Managed Care – PPO | Admitting: Family Medicine

## 2022-12-13 VITALS — BP 126/100 | HR 74 | Ht 66.0 in | Wt 190.0 lb

## 2022-12-13 DIAGNOSIS — J0100 Acute maxillary sinusitis, unspecified: Secondary | ICD-10-CM | POA: Diagnosis not present

## 2022-12-13 MED ORDER — PROMETHAZINE-DM 6.25-15 MG/5ML PO SYRP: 5.0000 mL | ORAL_SOLUTION | Freq: Four times a day (QID) | ORAL | 0 refills | Status: DC | PRN

## 2022-12-13 MED ORDER — AZITHROMYCIN 250 MG PO TABS: ORAL_TABLET | ORAL | 0 refills | Status: AC

## 2022-12-13 NOTE — Telephone Encounter (Signed)
Patient will be seen today in office.

## 2022-12-16 NOTE — Progress Notes (Signed)
     Primary Care / Sports Medicine Office Visit  Patient Information:  Patient ID: Artemio Greenlee, male DOB: 1986-06-08 Age: 37 y.o. MRN: [id number]   Montrail Ridgell is a pleasant 37 y.o. male presenting with the following:  Chief Complaint  Patient presents with   Otitis Media    X1 week, Both ears, left is worse, feel like water is in ears, facial pain under cheeks, dry cough at night, cough with clear/yellow mucous in the morning, headaches, no fever, pt had flu like symptoms 9 days ago but didn't test had chills      Vitals:   12/13/22 1036 12/13/22 1042  BP: (!) 148/100 (!) 126/100  Pulse: 74   SpO2: 99%    Vitals:   12/13/22 1036  Weight: 190 lb (86.2 kg)  Height: '5\' 6"'$  (1.676 m)   Body mass index is 30.67 kg/m.  No results found.   Independent interpretation of notes and tests performed by another provider:   None  Procedures performed:   None  Pertinent History, Exam, Impression, and Recommendations:   Trashaun was seen today for otitis media.  Acute non-recurrent maxillary sinusitis Assessment & Plan: 7 day course of symptoms, findings most consistent with left greater than right maxillary sinusitis, Rx regimen with azithromycin as needed Phenergan-DM prescribed.   Other orders -     Azithromycin; Take 2 tablets on day 1, then 1 tablet daily on days 2 through 5  Dispense: 6 tablet; Refill: 0 -     Promethazine-DM; Take 5 mLs by mouth 4 (four) times daily as needed for cough.  Dispense: 118 mL; Refill: 0     Orders & Medications Meds ordered this encounter  Medications   azithromycin (ZITHROMAX) 250 MG tablet    Sig: Take 2 tablets on day 1, then 1 tablet daily on days 2 through 5    Dispense:  6 tablet    Refill:  0   promethazine-dextromethorphan (PROMETHAZINE-DM) 6.25-15 MG/5ML syrup    Sig: Take 5 mLs by mouth 4 (four) times daily as needed for cough.    Dispense:  118 mL    Refill:  0   No orders of the defined types were placed in this  encounter.    No follow-ups on file.     Montel Culver, MD, [facility]   Primary Care Sports Medicine Primary Care and Sports Medicine at [facility]

## 2022-12-16 NOTE — Assessment & Plan Note (Addendum)
7 day course of symptoms, findings most consistent with left greater than right maxillary sinusitis, Rx regimen with azithromycin as needed Phenergan-DM prescribed.

## 2023-06-15 ENCOUNTER — Encounter: Payer: Self-pay | Admitting: Family Medicine

## 2023-06-15 ENCOUNTER — Ambulatory Visit (INDEPENDENT_AMBULATORY_CARE_PROVIDER_SITE_OTHER): Payer: BC Managed Care – PPO | Admitting: Family Medicine

## 2023-06-15 VITALS — BP 130/90 | HR 84 | Ht 66.0 in | Wt 190.0 lb

## 2023-06-15 DIAGNOSIS — Z114 Encounter for screening for human immunodeficiency virus [HIV]: Secondary | ICD-10-CM | POA: Diagnosis not present

## 2023-06-15 DIAGNOSIS — Z0000 Encounter for general adult medical examination without abnormal findings: Secondary | ICD-10-CM

## 2023-06-15 DIAGNOSIS — Z1159 Encounter for screening for other viral diseases: Secondary | ICD-10-CM | POA: Diagnosis not present

## 2023-06-15 DIAGNOSIS — E559 Vitamin D deficiency, unspecified: Secondary | ICD-10-CM

## 2023-06-15 DIAGNOSIS — M1A09X0 Idiopathic chronic gout, multiple sites, without tophus (tophi): Secondary | ICD-10-CM

## 2023-06-15 DIAGNOSIS — E782 Mixed hyperlipidemia: Secondary | ICD-10-CM

## 2023-06-15 MED ORDER — ALLOPURINOL 100 MG PO TABS: 100.0000 mg | ORAL_TABLET | Freq: Every day | ORAL | 3 refills | Status: DC

## 2023-06-16 ENCOUNTER — Encounter: Payer: BC Managed Care – PPO | Admitting: Family Medicine

## 2023-06-16 NOTE — Assessment & Plan Note (Signed)
Doing very well, no overt flares noted, compliant with medication.

## 2023-06-16 NOTE — Progress Notes (Signed)
Annual Physical Exam Visit  Patient Information:  Patient ID: Timothy Lawrence, male DOB: 1986-08-30 Age: 37 y.o. MRN: [id number]   Subjective:   CC: Annual Physical Exam  HPI:  Timothy Lawrence is here for their annual physical.  I reviewed the past medical history, family history, social history, surgical history, and allergies today and changes were made as necessary.  Please see the problem list section below for additional details.  Past Medical History: Past Medical History:  Diagnosis Date   Anxiety    Depression    Effusion, left knee 05/27/2021   Gout    Hyperlipidemia    Past Surgical History: Past Surgical History:  Procedure Laterality Date   TESTICLE TORSION REDUCTION     WISDOM TOOTH EXTRACTION     Family History: Family History  Problem Relation Age of Onset   Multiple sclerosis Mother    Healthy Father    Gout Brother    Heart attack Maternal Grandfather    Alzheimer's disease Paternal Grandmother    Allergies: Allergies  Allergen Reactions   Amoxicillin Hives   Health Maintenance: Health Maintenance  Topic Date Due   HIV Screening  Never done   Hepatitis C Screening  Never done   COVID-19 Vaccine (4 - 2023-24 season) 06/17/2022   INFLUENZA VACCINE  05/18/2023   DTaP/Tdap/Td (2 - Td or Tdap) 09/02/2025   HPV VACCINES  Aged Out    HM Colonoscopy     This patient has no relevant Health Maintenance data.      Medications: Current Outpatient Medications on File Prior to Visit  Medication Sig Dispense Refill   acetaminophen (TYLENOL) 500 MG tablet Take 1,000 mg by mouth every 4 (four) hours as needed for moderate pain.     No current facility-administered medications on file prior to visit.    Review of Systems: No headache, visual changes, nausea, vomiting, diarrhea, constipation, dizziness, abdominal pain, skin rash, fevers, chills, night sweats, swollen lymph nodes, weight loss, chest pain, body aches, joint swelling, muscle aches, shortness  of breath, mood changes, visual or auditory hallucinations reported.  Objective:   Vitals:   06/15/23 1527 06/15/23 1532  BP: (!) 138/98 (!) 130/90  Pulse: 84   SpO2: 97%    Vitals:   06/15/23 1527  Weight: 190 lb (86.2 kg)  Height: 5\' 6"  (1.676 m)   Body mass index is 30.67 kg/m.  General: Well Developed, well nourished, and in no acute distress.  Neuro: Alert and oriented x3, extra-ocular muscles intact, sensation grossly intact. Cranial nerves II through XII are grossly intact, motor, sensory, and coordinative functions are intact. HEENT: Normocephalic, atraumatic, pupils equal round reactive to light, neck supple, no masses, no lymphadenopathy, thyroid nonpalpable. Oropharynx, nasopharynx, external ear canals are unremarkable. Skin: Warm and dry, no rashes noted.  Cardiac: Regular rate and rhythm, no murmurs rubs or gallops. No peripheral edema. Pulses symmetric. Respiratory: Clear to auscultation bilaterally. Not using accessory muscles, speaking in full sentences.  Abdominal: Soft, nontender, nondistended, positive bowel sounds, no masses, no organomegaly. Musculoskeletal: Shoulder, elbow, wrist, hip, knee, ankle stable, and with full range of motion.  Impression and Recommendations:   The patient was counselled, risk factors were discussed, and anticipatory guidance given.  Problem List Items Addressed This Visit       Musculoskeletal and Integument   Chronic gout of multiple sites    Doing very well, no overt flares noted, compliant with medication.      Relevant Medications   allopurinol (  ZYLOPRIM) 100 MG tablet   Other Relevant Orders   Uric acid     Other   Hyperlipidemia, mixed   Relevant Orders   Comprehensive metabolic panel   Lipid panel   Healthcare maintenance - Primary    Annual examination completed, risk stratification labs ordered, anticipatory guidance provided.  We will follow labs once resulted.      Relevant Orders   CBC   Comprehensive  metabolic panel   Hepatitis C antibody   HIV Antibody (routine testing w rflx)   Lipid panel   PSA Total (Reflex To Free)   TSH   VITAMIN D 25 Hydroxy (Vit-D Deficiency, Fractures)   Uric acid   Other Visit Diagnoses     Screening for HIV (human immunodeficiency virus)       Relevant Orders   HIV Antibody (routine testing w rflx)   Need for hepatitis C screening test       Relevant Orders   Hepatitis C antibody   Vitamin D deficiency       Relevant Orders   VITAMIN D 25 Hydroxy (Vit-D Deficiency, Fractures)        Orders & Medications Medications:  Meds ordered this encounter  Medications   allopurinol (ZYLOPRIM) 100 MG tablet    Sig: Take 1 tablet (100 mg total) by mouth daily.    Dispense:  90 tablet    Refill:  3   Orders Placed This Encounter  Procedures   CBC   Comprehensive metabolic panel   Hepatitis C antibody   HIV Antibody (routine testing w rflx)   Lipid panel   PSA Total (Reflex To Free)   TSH   VITAMIN D 25 Hydroxy (Vit-D Deficiency, Fractures)   Uric acid     No follow-ups on file.    Jerrol Banana, MD, [facility]   Primary Care Sports Medicine Primary Care and Sports Medicine at [facility]

## 2023-06-16 NOTE — Patient Instructions (Signed)
-   Obtain fasting labs with orders provided (can have water or black coffee but otherwise no food or drink x 8 hours before labs) °- Review information provided °- Attend eye doctor annually, dentist every 6 months, work towards or maintain 30 minutes of moderate intensity physical activity at least 5 days per week, and consume a balanced diet °- Return in 1 year for physical °- Contact us for any questions between now and then °

## 2023-06-16 NOTE — Assessment & Plan Note (Signed)
Annual examination completed, risk stratification labs ordered, anticipatory guidance provided.  We will follow labs once resulted. 

## 2023-06-27 DIAGNOSIS — Z114 Encounter for screening for human immunodeficiency virus [HIV]: Secondary | ICD-10-CM | POA: Diagnosis not present

## 2023-06-27 DIAGNOSIS — Z0000 Encounter for general adult medical examination without abnormal findings: Secondary | ICD-10-CM | POA: Diagnosis not present

## 2023-06-27 DIAGNOSIS — M1A09X0 Idiopathic chronic gout, multiple sites, without tophus (tophi): Secondary | ICD-10-CM | POA: Diagnosis not present

## 2023-06-27 DIAGNOSIS — E782 Mixed hyperlipidemia: Secondary | ICD-10-CM | POA: Diagnosis not present

## 2023-06-27 DIAGNOSIS — Z1159 Encounter for screening for other viral diseases: Secondary | ICD-10-CM | POA: Diagnosis not present

## 2023-06-27 DIAGNOSIS — E559 Vitamin D deficiency, unspecified: Secondary | ICD-10-CM | POA: Diagnosis not present

## 2023-06-28 LAB — PSA TOTAL (REFLEX TO FREE): Prostate Specific Ag, Serum: 0.8 ng/mL (ref 0.0–4.0)

## 2023-06-28 LAB — COMPREHENSIVE METABOLIC PANEL
ALT: 32 IU/L (ref 0–44)
AST: 27 IU/L (ref 0–40)
Albumin: 4.9 g/dL (ref 4.1–5.1)
Alkaline Phosphatase: 94 IU/L (ref 44–121)
BUN/Creatinine Ratio: 14 (ref 9–20)
BUN: 14 mg/dL (ref 6–20)
Bilirubin Total: 0.3 mg/dL (ref 0.0–1.2)
CO2: 17 mmol/L — ABNORMAL LOW (ref 20–29)
Calcium: 9.3 mg/dL (ref 8.7–10.2)
Chloride: 100 mmol/L (ref 96–106)
Creatinine, Ser: 0.97 mg/dL (ref 0.76–1.27)
Globulin, Total: 2.5 g/dL (ref 1.5–4.5)
Glucose: 101 mg/dL — ABNORMAL HIGH (ref 70–99)
Potassium: 5 mmol/L (ref 3.5–5.2)
Sodium: 139 mmol/L (ref 134–144)
Total Protein: 7.4 g/dL (ref 6.0–8.5)
eGFR: 103 mL/min/{1.73_m2} (ref >59)

## 2023-06-28 LAB — HEPATITIS C ANTIBODY: Hep C Virus Ab: NONREACTIVE

## 2023-06-28 LAB — LIPID PANEL
Chol/HDL Ratio: 5.6 ratio — ABNORMAL HIGH (ref 0.0–5.0)
Cholesterol, Total: 252 mg/dL — ABNORMAL HIGH (ref 100–199)
HDL: 45 mg/dL (ref >39)
LDL Chol Calc (NIH): 177 mg/dL — ABNORMAL HIGH (ref 0–99)
Triglycerides: 163 mg/dL — ABNORMAL HIGH (ref 0–149)
VLDL Cholesterol Cal: 30 mg/dL (ref 5–40)

## 2023-06-28 LAB — CBC
Hematocrit: 45.2 % (ref 37.5–51.0)
Hemoglobin: 14.7 g/dL (ref 13.0–17.7)
MCH: 30.1 pg (ref 26.6–33.0)
MCHC: 32.5 g/dL (ref 31.5–35.7)
MCV: 93 fL (ref 79–97)
Platelets: 219 10*3/uL (ref 150–450)
RBC: 4.88 x10E6/uL (ref 4.14–5.80)
RDW: 12.8 % (ref 11.6–15.4)
WBC: 6.4 10*3/uL (ref 3.4–10.8)

## 2023-06-28 LAB — TSH: TSH: 1.4 u[IU]/mL (ref 0.450–4.500)

## 2023-06-28 LAB — VITAMIN D 25 HYDROXY (VIT D DEFICIENCY, FRACTURES): Vit D, 25-Hydroxy: 22.2 ng/mL — ABNORMAL LOW (ref 30.0–100.0)

## 2023-06-28 LAB — URIC ACID: Uric Acid: 6.9 mg/dL (ref 3.8–8.4)

## 2023-06-28 LAB — HIV ANTIBODY (ROUTINE TESTING W REFLEX): HIV Screen 4th Generation wRfx: NONREACTIVE

## 2023-07-24 ENCOUNTER — Encounter: Payer: Self-pay | Admitting: Family Medicine

## 2023-07-24 ENCOUNTER — Other Ambulatory Visit: Payer: Self-pay | Admitting: Family Medicine

## 2023-07-24 DIAGNOSIS — E559 Vitamin D deficiency, unspecified: Secondary | ICD-10-CM

## 2023-07-24 MED ORDER — VITAMIN D (ERGOCALCIFEROL) 1.25 MG (50000 UNIT) PO CAPS: 50000.0000 [IU] | ORAL_CAPSULE | ORAL | 0 refills | Status: DC

## 2023-07-25 NOTE — Telephone Encounter (Signed)
Please advise 

## 2023-08-16 ENCOUNTER — Other Ambulatory Visit: Payer: Self-pay | Admitting: Family Medicine

## 2023-08-16 DIAGNOSIS — E559 Vitamin D deficiency, unspecified: Secondary | ICD-10-CM

## 2023-08-17 NOTE — Telephone Encounter (Signed)
Requested medication (s) are due for refill today: yes  Requested medication (s) are on the active medication list: yes  Last refill:  07/24/23  Future visit scheduled: yes  Notes to clinic:   Manual Review: Route requests for 50,000 IU strength to the provider      Requested Prescriptions  Pending Prescriptions Disp Refills   Vitamin D, Ergocalciferol, (DRISDOL) 1.25 MG (50000 UNIT) CAPS capsule [Pharmacy Med Name: VITAMIN D2 1.25MG (50,000 UNIT)] 12 capsule 1    Sig: Take 1 capsule (50,000 Units total) by mouth every 7 (seven) days. Take for 8 total doses(weeks)     Endocrinology:  Vitamins - Vitamin D Supplementation 2 Failed - 08/16/2023 12:30 PM      Failed - Manual Review: Route requests for 50,000 IU strength to the provider      Failed - Vitamin D in normal range and within 360 days    Vit D, 25-Hydroxy  Date Value Ref Range Status  06/27/2023 22.2 (L) 30.0 - 100.0 ng/mL Final    Comment:    Vitamin D deficiency has been defined by the Institute of Medicine and an Endocrine Society practice guideline as a level of serum 25-OH vitamin D less than 20 ng/mL (1,2). The Endocrine Society went on to further define vitamin D insufficiency as a level between 21 and 29 ng/mL (2). 1. IOM (Institute of Medicine). 2010. Dietary reference    intakes for calcium and D. [place]: The    [employer]. 2. Holick MF, Binkley Victoria, Bischoff-Ferrari HA, et al.    Evaluation, treatment, and prevention of vitamin D    deficiency: an Endocrine Society clinical practice    guideline. JCEM. 2011 Jul; 96(7):1911-30.          Passed - Ca in normal range and within 360 days    Calcium  Date Value Ref Range Status  06/27/2023 9.3 8.7 - 10.2 mg/dL Final         Passed - Valid encounter within last 12 months    Recent Outpatient Visits           2 months ago Healthcare maintenance   [facility] at [name], [name], MD   8  months ago Acute non-recurrent maxillary sinusitis   [place] [name], [name], MD   1 year ago Annual physical exam   [facility] at [name], [name], MD   2 years ago Need for immunization against influenza   [facility] at [name], [name], MD   2 years ago Annual physical exam   [facility] & [facility] Medicine at [facility], [name], MD       Future Appointments             In 4 months [name], [name], MD [facility] Primary Care & Sports Medicine at [facility], [facility]

## 2023-08-18 ENCOUNTER — Other Ambulatory Visit: Payer: Self-pay | Admitting: Family Medicine

## 2023-08-18 ENCOUNTER — Encounter: Payer: Self-pay | Admitting: Family Medicine

## 2023-08-18 DIAGNOSIS — E559 Vitamin D deficiency, unspecified: Secondary | ICD-10-CM

## 2023-08-18 MED ORDER — VITAMIN D (ERGOCALCIFEROL) 1.25 MG (50000 UNIT) PO CAPS: 50000.0000 [IU] | ORAL_CAPSULE | ORAL | 0 refills | Status: DC

## 2023-12-15 ENCOUNTER — Ambulatory Visit: Payer: BC Managed Care – PPO | Admitting: Family Medicine

## 2023-12-15 ENCOUNTER — Encounter: Payer: Self-pay | Admitting: Family Medicine

## 2023-12-15 VITALS — BP 150/90 | HR 100 | Ht 66.0 in | Wt 195.0 lb

## 2023-12-15 DIAGNOSIS — F411 Generalized anxiety disorder: Secondary | ICD-10-CM | POA: Diagnosis not present

## 2023-12-15 DIAGNOSIS — F1010 Alcohol abuse, uncomplicated: Secondary | ICD-10-CM | POA: Diagnosis not present

## 2023-12-15 DIAGNOSIS — I158 Other secondary hypertension: Secondary | ICD-10-CM

## 2023-12-15 MED ORDER — LISINOPRIL 5 MG PO TABS: 5.0000 mg | ORAL_TABLET | Freq: Every day | ORAL | 0 refills | Status: DC

## 2023-12-15 MED ORDER — SERTRALINE HCL 50 MG PO TABS: 50.0000 mg | ORAL_TABLET | Freq: Every day | ORAL | 0 refills | Status: DC

## 2023-12-15 NOTE — Progress Notes (Signed)
 Primary Care / Sports Medicine Office Visit  Patient Information:  Patient ID: Timothy Lawrence, male DOB: 1986/06/28 Age: 38 y.o. MRN: [id number]   Timothy Lawrence is a pleasant 38 y.o. male presenting with the following:  Chief Complaint  Patient presents with   Hypertension    Patient following up on blood pressure. He does not take anything for his blood pressure.     Vitals:   12/15/23 1415  BP: (!) 150/90  Pulse: 100  SpO2: 97%   Vitals:   12/15/23 1415  Weight: 195 lb (88.5 kg)  Height: 5\' 6"  (1.676 m)   Body mass index is 31.47 kg/m.  No results found.   Independent interpretation of notes and tests performed by another provider:   None  Procedures performed:   None  Pertinent History, Exam, Impression, and Recommendations:   Problem List Items Addressed This Visit     Alcohol use disorder, mild, abuse   He drinks alcohol moderately, consuming three to four drinks on most weeknights and occasionally binge drinking on weekends. He and his wife are actively working on reducing their alcohol consumption together.  Alcohol consumption Moderate alcohol consumption most days of the week, potentially contributing to hypertension and stress/anxiety. -Encourage gradual reduction in alcohol consumption.      GAD (generalized anxiety disorder)   He has a history of stress and anxiety, which he believes contributes to his elevated blood pressure. He feels anxious and uses alcohol as a coping mechanism for anxiety and social situations. His GAD score is 17, indicating elevated anxiety levels, while his PHQ score remains within normal limits. He has previously been on medications for stress, including Lexapro and Wellbutrin, and has undergone therapy for depression in the past, around 2017-2018. He recalls that Wellbutrin may have made him more irritable.  Stress/Anxiety Elevated GAD score indicating increased stress/anxiety levels. Previous history of treatment with Lexapro  and Wellbutrin. -Start SSRI sertraline 50 mg (same family as Lexapro) for stress management. -Refer to psychology for cognitive behavioral therapy. -Consider potential future use of Wegovy if covered by insurance.      Relevant Medications   sertraline (ZOLOFT) 50 MG tablet   Other Relevant Orders   Ambulatory referral to Psychology   Other secondary hypertension - Primary   He is concerned about elevated blood pressure, attributing it to stress and alcohol consumption. His blood pressure has been high again, and he has not made significant changes to his alcohol consumption since his last visit.  Physical Exam CHEST: Clear to auscultation bilaterally, no wheezes, rhonchi, or crackles. CARDIOVASCULAR: Normal heart rate and rhythm, S1 and S2 normal without murmurs.  Hypertension Elevated blood pressure likely secondary to external factors including stress and alcohol consumption. No primary cardiac issue identified. -Start Lisinopril 5 mg for blood pressure control. -Check blood pressure at local pharmacy and report values. -Reevaluate in two months.      Relevant Medications   lisinopril (ZESTRIL) 5 MG tablet     Orders & Medications Medications:  Meds ordered this encounter  Medications   lisinopril (ZESTRIL) 5 MG tablet    Sig: Take 1 tablet (5 mg total) by mouth daily.    Dispense:  60 tablet    Refill:  0   sertraline (ZOLOFT) 50 MG tablet    Sig: Take 1 tablet (50 mg total) by mouth daily.    Dispense:  60 tablet    Refill:  0   Orders Placed This Encounter  Procedures  Ambulatory referral to Psychology     Return in about 2 months (around 02/12/2024) for HTN and med f/u.     Jerrol Banana, MD, [facility]   Primary Care Sports Medicine Primary Care and Sports Medicine at [facility]

## 2023-12-19 ENCOUNTER — Encounter: Payer: Self-pay | Admitting: Family Medicine

## 2023-12-19 DIAGNOSIS — I158 Other secondary hypertension: Secondary | ICD-10-CM | POA: Insufficient documentation

## 2023-12-19 DIAGNOSIS — I10 Essential (primary) hypertension: Secondary | ICD-10-CM | POA: Insufficient documentation

## 2023-12-19 NOTE — Assessment & Plan Note (Signed)
 He is concerned about elevated blood pressure, attributing it to stress and alcohol consumption. His blood pressure has been high again, and he has not made significant changes to his alcohol consumption since his last visit.  Physical Exam CHEST: Clear to auscultation bilaterally, no wheezes, rhonchi, or crackles. CARDIOVASCULAR: Normal heart rate and rhythm, S1 and S2 normal without murmurs.  Hypertension Elevated blood pressure likely secondary to external factors including stress and alcohol consumption. No primary cardiac issue identified. -Start Lisinopril 5 mg for blood pressure control. -Check blood pressure at local pharmacy and report values. -Reevaluate in two months.

## 2023-12-19 NOTE — Patient Instructions (Signed)
 Patient Plan  Alcohol Use:  1. Gradually reduce alcohol consumption. Work with your wife to support each other in this effort.  Anxiety and Stress:  1. Begin taking sertraline (Zoloft) 50 mg daily to help manage stress and anxiety. 2. Schedule an appointment with a psychologist for cognitive behavioral therapy. 3. Discuss the potential future use of Wegovy with your healthcare provider if it becomes covered by your insurance.  Hypertension:  1. Start taking Lisinopril 5 mg daily to help control your blood pressure. 2. Check your blood pressure regularly at your local pharmacy and keep a record of the results. 3. Plan to reevaluate your condition in two months with your healthcare provider.  Red Flags:  - Contact your healthcare provider if you experience severe side effects from any medication, significant mood changes, or if your blood pressure readings remain consistently high despite medication.

## 2023-12-19 NOTE — Assessment & Plan Note (Signed)
 He has a history of stress and anxiety, which he believes contributes to his elevated blood pressure. He feels anxious and uses alcohol as a coping mechanism for anxiety and social situations. His GAD score is 17, indicating elevated anxiety levels, while his PHQ score remains within normal limits. He has previously been on medications for stress, including Lexapro and Wellbutrin, and has undergone therapy for depression in the past, around 2017-2018. He recalls that Wellbutrin may have made him more irritable.  Stress/Anxiety Elevated GAD score indicating increased stress/anxiety levels. Previous history of treatment with Lexapro and Wellbutrin. -Start SSRI sertraline 50 mg (same family as Lexapro) for stress management. -Refer to psychology for cognitive behavioral therapy. -Consider potential future use of Wegovy if covered by insurance.

## 2023-12-19 NOTE — Assessment & Plan Note (Signed)
 He drinks alcohol moderately, consuming three to four drinks on most weeknights and occasionally binge drinking on weekends. He and his wife are actively working on reducing their alcohol consumption together.  Alcohol consumption Moderate alcohol consumption most days of the week, potentially contributing to hypertension and stress/anxiety. -Encourage gradual reduction in alcohol consumption.

## 2024-01-08 ENCOUNTER — Other Ambulatory Visit: Payer: Self-pay | Admitting: Family Medicine

## 2024-01-08 DIAGNOSIS — I158 Other secondary hypertension: Secondary | ICD-10-CM

## 2024-01-08 DIAGNOSIS — F411 Generalized anxiety disorder: Secondary | ICD-10-CM

## 2024-01-09 NOTE — Telephone Encounter (Signed)
 Requested by interface surescripts. Future visit in 1 month.  Requested Prescriptions  Pending Prescriptions Disp Refills   lisinopril (ZESTRIL) 5 MG tablet [Pharmacy Med Name: LISINOPRIL 5 MG TABLET] 60 tablet 0    Sig: TAKE 1 TABLET (5 MG TOTAL) BY MOUTH DAILY.     Cardiovascular:  ACE Inhibitors Failed - 01/09/2024 12:45 PM      Failed - Cr in normal range and within 180 days    Creatinine, Ser  Date Value Ref Range Status  06/27/2023 0.97 0.76 - 1.27 mg/dL Final         Failed - K in normal range and within 180 days    Potassium  Date Value Ref Range Status  06/27/2023 5.0 3.5 - 5.2 mmol/L Final         Failed - Last BP in normal range    BP Readings from Last 1 Encounters:  12/15/23 (!) 150/90         Failed - Valid encounter within last 6 months    Recent Outpatient Visits           6 months ago Healthcare maintenance   [facility] Timothy Bob, MD   1 year ago Acute non-recurrent maxillary sinusitis   [place] [facility], Timothy Bob, MD   1 year ago Annual physical exam   [facility], Timothy Bob, MD   2 years ago Need for immunization against influenza   [facility], Timothy Bob, MD   2 years ago Annual physical exam   [facility], Timothy Bob, MD       Future Appointments             In 1 month Timothy Lawrence, Timothy Bob, MD [facility] Care [facility] Sports Medicine at [facility], [facility]            Passed - Patient is not pregnant       sertraline (ZOLOFT) 50 MG tablet [Pharmacy Med Name: SERTRALINE HCL 50 MG TABLET] 60 tablet 0    Sig: TAKE 1 TABLET BY MOUTH EVERY DAY     Psychiatry:  Antidepressants - SSRI - sertraline Failed - 01/09/2024 12:45 PM      Failed - Valid encounter within  last 6 months    Recent Outpatient Visits           6 months ago Healthcare maintenance   [facility], Timothy Bob, MD   1 year ago Acute non-recurrent maxillary sinusitis   [place] [facility], Timothy Bob, MD   1 year ago Annual physical exam   [facility], Timothy Bob, MD   2 years ago Need for immunization against influenza   [facility] Primary [facility] at [facility], Timothy Bob, MD   2 years ago Annual physical exam   [facility] Primary Care & Sports Medicine at [facility], Timothy Bob, MD       Future Appointments             In 1 month Timothy Lawrence, Timothy Bob, MD [facility] Primary Care & Sports Medicine at [facility]  [name]            Passed - AST in normal range and within 360 days    AST  Date Value Ref Range Status  06/27/2023 27 0 - 40 IU/L Final         Passed - ALT in normal range and within 360 days    ALT  Date Value Ref Range Status  06/27/2023 32 0 - 44 IU/L Final         Passed - Completed PHQ-2 or PHQ-9 in the last 360 days

## 2024-01-26 ENCOUNTER — Encounter: Payer: Self-pay | Admitting: Family Medicine

## 2024-01-26 NOTE — Telephone Encounter (Signed)
 Please review.  KP

## 2024-02-08 ENCOUNTER — Other Ambulatory Visit: Payer: Self-pay | Admitting: Family Medicine

## 2024-02-08 DIAGNOSIS — I158 Other secondary hypertension: Secondary | ICD-10-CM

## 2024-02-08 DIAGNOSIS — F411 Generalized anxiety disorder: Secondary | ICD-10-CM

## 2024-02-09 NOTE — Telephone Encounter (Signed)
 Requested Prescriptions  Refused Prescriptions Disp Refills   lisinopril  (ZESTRIL ) 5 MG tablet [Pharmacy Med Name: LISINOPRIL  5 MG TABLET] 90 tablet 1    Sig: TAKE 1 TABLET (5 MG TOTAL) BY MOUTH DAILY.     Cardiovascular:  ACE Inhibitors Failed - 02/09/2024 11:03 AM      Failed - Cr in normal range and within 180 days    Creatinine, Ser  Date Value Ref Range Status  06/27/2023 0.97 0.76 - 1.27 mg/dL Final         Failed - K in normal range and within 180 days    Potassium  Date Value Ref Range Status  06/27/2023 5.0 3.5 - 5.2 mmol/L Final         Failed - Last BP in normal range    BP Readings from Last 1 Encounters:  12/15/23 (!) 150/90         Passed - Patient is not pregnant      Passed - Valid encounter within last 6 months    Recent Outpatient Visits           1 month ago Other secondary hypertension   [place] [facility] at [facility], Dessie Flow, MD       Future Appointments             In 3 days Augustus Ledger, Dessie Flow, MD [facility] Primary Care & Sports Medicine at [facility], [facility]             sertraline  (ZOLOFT ) 50 MG tablet [Pharmacy Med Name: SERTRALINE  HCL 50 MG TABLET] 90 tablet 1    Sig: TAKE 1 TABLET BY MOUTH EVERY DAY     Psychiatry:  Antidepressants - SSRI - sertraline  Passed - 02/09/2024 11:03 AM      Passed - AST in normal range and within 360 days    AST  Date Value Ref Range Status  06/27/2023 27 0 - 40 IU/L Final         Passed - ALT in normal range and within 360 days    ALT  Date Value Ref Range Status  06/27/2023 32 0 - 44 IU/L Final         Passed - Completed PHQ-2 or PHQ-9 in the last 360 days      Passed - Valid encounter within last 6 months    Recent Outpatient Visits           1 month ago Other secondary hypertension    Primary Care & Sports Medicine at [facility], Dessie Flow, MD       Future Appointments             In 3 days Augustus Ledger, Dessie Flow, MD [facility]  Health Primary Care & Sports Medicine at [facility], [facility]

## 2024-02-12 ENCOUNTER — Ambulatory Visit (INDEPENDENT_AMBULATORY_CARE_PROVIDER_SITE_OTHER): Payer: BC Managed Care – PPO | Admitting: Family Medicine

## 2024-02-12 ENCOUNTER — Encounter: Payer: Self-pay | Admitting: Family Medicine

## 2024-02-12 VITALS — BP 124/84 | HR 87 | Ht 66.0 in | Wt 194.0 lb

## 2024-02-12 DIAGNOSIS — I158 Other secondary hypertension: Secondary | ICD-10-CM | POA: Diagnosis not present

## 2024-02-12 DIAGNOSIS — F411 Generalized anxiety disorder: Secondary | ICD-10-CM

## 2024-02-12 MED ORDER — LISINOPRIL 5 MG PO TABS: 5.0000 mg | ORAL_TABLET | Freq: Every day | ORAL | 1 refills | Status: DC

## 2024-02-12 NOTE — Patient Instructions (Signed)
 Patient Plan  Generalized Anxiety Disorder (GAD):  1. Discontinue sertraline  (Zoloft ). 2. Monitor your mood and anxiety symptoms regularly. 3. Consider exploring non-medication treatments such as therapy, relaxation techniques, or lifestyle changes. 4. If anxiety symptoms persist, discuss the possibility of a referral to a psychiatrist with your healthcare provider.  Hypertension:  1. Continue taking lisinopril  5 mg daily as prescribed. 2. You will receive a 59-month supply of lisinopril . 3. Schedule a follow-up appointment in 6 months to review your condition and refill your prescription. 4. Plan to have a physical exam at your next visit.  Red Flags:  - Contact your healthcare provider if you experience any significant changes in mood, such as increased depression or anxiety, or if you have any concerns about your blood pressure or medication effects.

## 2024-02-12 NOTE — Progress Notes (Signed)
     Primary Care / Sports Medicine Office Visit  Patient Information:  Patient ID: Timothy Lawrence, male DOB: 26-Dec-1985 Age: 38 y.o. MRN: [id number]   Timothy Lawrence is a pleasant 38 y.o. male presenting with the following:  Chief Complaint  Patient presents with   Hypertension    Patient is here to follow up on HTN. He is doing well and has been taking his medication as directed.   Depression    Patient has taken himself off of the Zoloft  for the past 4 days and feels much better than he did when actually on the medication. Patient states the Zoloft  made him more derepressed than he originally was. He also became restless.     Vitals:   02/12/24 1320  BP: 124/84  Pulse: 87  SpO2: 98%   Vitals:   02/12/24 1320  Weight: 194 lb (88 kg)  Height: 5\' 6"  (1.676 m)   Body mass index is 31.31 kg/m.  No results found.   Independent interpretation of notes and tests performed by another provider:   None  Procedures performed:   None  Pertinent History, Exam, Impression, and Recommendations:   Problem List Items Addressed This Visit     GAD (generalized anxiety disorder) - Primary   History of Present Illness Timothy Lawrence is a 38 year old male with a history of depression and anxiety who presents with depression and anxiety symptoms after discontinuing Zoloft .  He experienced increased depression, anhedonia, and social withdrawal after starting Zoloft . He described feeling 'zombified' for the first three to four weeks and then progressed to feeling like a 'mummy', unable to engage in daily activities effectively. No thoughts of self-harm or suicide were present during this period, and he maintained a belief that his condition was temporary and would improve. He reduced the Zoloft  dose to half as suggested, but continued to have bad days, leading to the decision to stop the medication completely 4 days prior.  Since discontinuing Zoloft  four days ago, he feels very good and better than  before starting the medication. He notes an immediate improvement in mood the day after his last dose, despite expectations that the medication would linger in his system.  Anxiety Symptoms improved after discontinuing sertraline . Discussed neurotransmitter and alternative treatment options. Emphasized non-pharmacological interventions. - Discontinue sertraline . - Monitor mood and symptoms. - Consider non-pharmacological interventions. - Discuss potential psychiatry referral if symptoms persist.      Other secondary hypertension   He is currently taking lisinopril  at night. His blood pressure is normal, and he feels that this may contribute to his improved well-being.  Hypertension Hypertension well-controlled with lisinopril . Blood pressure normal. No adverse effects reported. - Continue lisinopril  5 mg. - Prescribe 33-month supply of lisinopril . - Schedule follow-up in 6 months. - Return for physical at that time.      Relevant Medications   lisinopril  (ZESTRIL ) 5 MG tablet     Orders & Medications Medications:  Meds ordered this encounter  Medications   lisinopril  (ZESTRIL ) 5 MG tablet    Sig: Take 1 tablet (5 mg total) by mouth daily.    Dispense:  90 tablet    Refill:  1   No orders of the defined types were placed in this encounter.    Return in about 6 months (around 08/13/2024) for CPE.     Ma Saupe, MD, [facility]   Primary Care Sports Medicine Primary Care and Sports Medicine at [name]

## 2024-02-12 NOTE — Assessment & Plan Note (Addendum)
 He is currently taking lisinopril  at night. His blood pressure is normal, and he feels that this may contribute to his improved well-being.  Hypertension Hypertension well-controlled with lisinopril . Blood pressure normal. No adverse effects reported. - Continue lisinopril  5 mg. - Prescribe 63-month supply of lisinopril . - Schedule follow-up in 6 months. - Return for physical at that time.

## 2024-02-12 NOTE — Assessment & Plan Note (Addendum)
 History of Present Illness Timothy Lawrence is a 38 year old male with a history of depression and anxiety who presents with depression and anxiety symptoms after discontinuing Zoloft .  He experienced increased depression, anhedonia, and social withdrawal after starting Zoloft . He described feeling 'zombified' for the first three to four weeks and then progressed to feeling like a 'mummy', unable to engage in daily activities effectively. No thoughts of self-harm or suicide were present during this period, and he maintained a belief that his condition was temporary and would improve. He reduced the Zoloft  dose to half as suggested, but continued to have bad days, leading to the decision to stop the medication completely 4 days prior.  Since discontinuing Zoloft  four days ago, he feels very good and better than before starting the medication. He notes an immediate improvement in mood the day after his last dose, despite expectations that the medication would linger in his system.  Anxiety Symptoms improved after discontinuing sertraline . Discussed neurotransmitter and alternative treatment options. Emphasized non-pharmacological interventions. - Discontinue sertraline . - Monitor mood and symptoms. - Consider non-pharmacological interventions. - Discuss potential psychiatry referral if symptoms persist.

## 2024-03-15 ENCOUNTER — Other Ambulatory Visit: Payer: Self-pay | Admitting: Family Medicine

## 2024-03-15 DIAGNOSIS — F411 Generalized anxiety disorder: Secondary | ICD-10-CM

## 2024-03-16 NOTE — Telephone Encounter (Signed)
  The original prescription was discontinued on 02/12/2024 by Ma Saupe, MD   Requested Prescriptions  Pending Prescriptions Disp Refills   sertraline  (ZOLOFT ) 50 MG tablet [Pharmacy Med Name: SERTRALINE  HCL 50 MG TABLET] 30 tablet 1    Sig: TAKE 1 TABLET BY MOUTH EVERY DAY     Psychiatry:  Antidepressants - SSRI - sertraline  Passed - 03/16/2024  2:56 PM      Passed - AST in normal range and within 360 days    AST  Date Value Ref Range Status  06/27/2023 27 0 - 40 IU/L Final         Passed - ALT in normal range and within 360 days    ALT  Date Value Ref Range Status  06/27/2023 32 0 - 44 IU/L Final         Passed - Completed PHQ-2 or PHQ-9 in the last 360 days      Passed - Valid encounter within last 6 months    Recent Outpatient Visits           1 month ago GAD (generalized anxiety disorder)   [place] [facility] at [facility], Dessie Flow, MD   3 months ago Other secondary hypertension   [place] [facility] at [facility], Dessie Flow, MD       Future Appointments             In 5 months Augustus Ledger, Dessie Flow, MD [facility] Primary Care & Sports Medicine at [facility], [facility]

## 2024-07-02 ENCOUNTER — Other Ambulatory Visit: Payer: Self-pay | Admitting: Family Medicine

## 2024-07-02 DIAGNOSIS — M1A09X0 Idiopathic chronic gout, multiple sites, without tophus (tophi): Secondary | ICD-10-CM

## 2024-07-03 ENCOUNTER — Encounter: Payer: Self-pay | Admitting: Family Medicine

## 2024-07-03 ENCOUNTER — Other Ambulatory Visit: Payer: Self-pay

## 2024-07-03 DIAGNOSIS — M1A09X0 Idiopathic chronic gout, multiple sites, without tophus (tophi): Secondary | ICD-10-CM

## 2024-07-03 MED ORDER — ALLOPURINOL 100 MG PO TABS: 100.0000 mg | ORAL_TABLET | Freq: Every day | ORAL | 3 refills | Status: AC

## 2024-07-03 NOTE — Telephone Encounter (Signed)
 LOV 02/08/24. Please review and advise. Thank you.  JM

## 2024-07-03 NOTE — Telephone Encounter (Signed)
 Requested medication (s) are due for refill today - yes  Requested medication (s) are on the active medication list -yes  Future visit scheduled -yes  Last refill: 06/15/23 #90 3RF  Notes to clinic: fails lab protocol- over 1 year- 06/27/23  Requested Prescriptions  Pending Prescriptions Disp Refills   allopurinol  (ZYLOPRIM ) 100 MG tablet [Pharmacy Med Name: ALLOPURINOL  100 MG TABLET] 90 tablet 3    Sig: TAKE 1 TABLET BY MOUTH EVERY DAY     Endocrinology:  Gout Agents - allopurinol  Failed - 07/03/2024 12:12 PM      Failed - Uric Acid in normal range and within 360 days    Uric Acid  Date Value Ref Range Status  06/27/2023 6.9 3.8 - 8.4 mg/dL Final    Comment:               Therapeutic target for gout patients: <6.0         Failed - Cr in normal range and within 360 days    Creatinine, Ser  Date Value Ref Range Status  06/27/2023 0.97 0.76 - 1.27 mg/dL Final         Failed - CBC within normal limits and completed in the last 12 months    WBC  Date Value Ref Range Status  06/27/2023 6.4 3.4 - 10.8 x10E3/uL Final  12/30/2021 6.1 4.0 - 10.5 K/uL Final   RBC  Date Value Ref Range Status  06/27/2023 4.88 4.14 - 5.80 x10E6/uL Final  12/30/2021 5.01 4.22 - 5.81 MIL/uL Final   Hemoglobin  Date Value Ref Range Status  06/27/2023 14.7 13.0 - 17.7 g/dL Final   Hematocrit  Date Value Ref Range Status  06/27/2023 45.2 37.5 - 51.0 % Final   MCHC  Date Value Ref Range Status  06/27/2023 32.5 31.5 - 35.7 g/dL Final  96/83/7976 64.7 30.0 - 36.0 g/dL Final   [facility]  Date Value Ref Range Status  06/27/2023 30.1 26.6 - 33.0 pg Final  12/30/2021 30.1 26.0 - 34.0 pg Final   MCV  Date Value Ref Range Status  06/27/2023 93 79 - 97 fL Final   No results found for: PLTCOUNTKUC, LABPLAT, POCPLA RDW  Date Value Ref Range Status  06/27/2023 12.8 11.6 - 15.4 % Final         Passed - Valid encounter within last 12 months    Recent Outpatient Visits           4 months ago  GAD (generalized anxiety disorder)   [place] [facility] at [facility], Selinda PARAS, MD   6 months ago Other secondary hypertension   [place] [facility] at [name], Selinda PARAS, MD       Future Appointments             In 1 month Ku Selinda PARAS, MD [facility] Primary Care & Sports Medicine at [facility], [phone] [place]               Requested Prescriptions  Pending Prescriptions Disp Refills   allopurinol  (ZYLOPRIM ) 100 MG tablet [Pharmacy Med Name: ALLOPURINOL  100 MG TABLET] 90 tablet 3    Sig: TAKE 1 TABLET BY MOUTH EVERY DAY     Endocrinology:  Gout Agents - allopurinol  Failed - 07/03/2024 12:12 PM      Failed - Uric Acid in normal range and within 360 days    Uric Acid  Date Value Ref Range Status  06/27/2023 6.9  3.8 - 8.4 mg/dL Final    Comment:               Therapeutic target for gout patients: <6.0         Failed - Cr in normal range and within 360 days    Creatinine, Ser  Date Value Ref Range Status  06/27/2023 0.97 0.76 - 1.27 mg/dL Final         Failed - CBC within normal limits and completed in the last 12 months    WBC  Date Value Ref Range Status  06/27/2023 6.4 3.4 - 10.8 x10E3/uL Final  12/30/2021 6.1 4.0 - 10.5 K/uL Final   RBC  Date Value Ref Range Status  06/27/2023 4.88 4.14 - 5.80 x10E6/uL Final  12/30/2021 5.01 4.22 - 5.81 MIL/uL Final   Hemoglobin  Date Value Ref Range Status  06/27/2023 14.7 13.0 - 17.7 g/dL Final   Hematocrit  Date Value Ref Range Status  06/27/2023 45.2 37.5 - 51.0 % Final   MCHC  Date Value Ref Range Status  06/27/2023 32.5 31.5 - 35.7 g/dL Final  96/83/7976 64.7 30.0 - 36.0 g/dL Final   [facility]  Date Value Ref Range Status  06/27/2023 30.1 26.6 - 33.0 pg Final  12/30/2021 30.1 26.0 - 34.0 pg Final   MCV  Date Value Ref Range Status  06/27/2023 93 79 - 97 fL Final   No results found for: PLTCOUNTKUC, LABPLAT, POCPLA RDW   Date Value Ref Range Status  06/27/2023 12.8 11.6 - 15.4 % Final         Passed - Valid encounter within last 12 months    Recent Outpatient Visits           4 months ago GAD (generalized anxiety disorder)   [place] [facility] at [facility], Selinda PARAS, MD   6 months ago Other secondary hypertension   [facility] at [facility], Selinda PARAS, MD       Future Appointments             In 1 month Ku, Selinda PARAS, MD [facility] Primary Care & Sports Medicine at [facility], [phone] [place]

## 2024-08-13 ENCOUNTER — Encounter: Payer: Self-pay | Admitting: Family Medicine

## 2024-08-13 ENCOUNTER — Ambulatory Visit (INDEPENDENT_AMBULATORY_CARE_PROVIDER_SITE_OTHER): Admitting: Family Medicine

## 2024-08-13 VITALS — BP 120/80 | HR 84 | Temp 98.2°F | Ht 66.0 in | Wt 193.0 lb

## 2024-08-13 DIAGNOSIS — M1A09X0 Idiopathic chronic gout, multiple sites, without tophus (tophi): Secondary | ICD-10-CM | POA: Diagnosis not present

## 2024-08-13 DIAGNOSIS — F1010 Alcohol abuse, uncomplicated: Secondary | ICD-10-CM

## 2024-08-13 DIAGNOSIS — I10 Essential (primary) hypertension: Secondary | ICD-10-CM

## 2024-08-13 DIAGNOSIS — F411 Generalized anxiety disorder: Secondary | ICD-10-CM | POA: Diagnosis not present

## 2024-08-13 DIAGNOSIS — Z0000 Encounter for general adult medical examination without abnormal findings: Secondary | ICD-10-CM

## 2024-08-13 DIAGNOSIS — K589 Irritable bowel syndrome without diarrhea: Secondary | ICD-10-CM

## 2024-08-13 NOTE — Progress Notes (Signed)
 Annual Physical Exam Visit  Patient Information:  Patient ID: Timothy Lawrence, male DOB: April 17, 1986 Age: 38 y.o. MRN: [id number]   Subjective:   CC: Annual Physical Exam  HPI:  Timothy Lawrence is here for their annual physical.  I reviewed the past medical history, family history, social history, surgical history, and allergies today and changes were made as necessary.  Please see the problem list section below for additional details.  Past Medical History: Past Medical History:  Diagnosis Date   Anxiety    Depression    Effusion, left knee 05/27/2021   Gout    Hyperlipidemia    Past Surgical History: Past Surgical History:  Procedure Laterality Date   TESTICLE TORSION REDUCTION     WISDOM TOOTH EXTRACTION     Family History: Family History  Problem Relation Age of Onset   Multiple sclerosis Mother    Healthy Father    Gout Brother    Heart attack Maternal Grandfather    Alzheimer's disease Paternal Grandmother    Allergies: Allergies  Allergen Reactions   Amoxicillin Hives   Health Maintenance: Health Maintenance  Topic Date Due   COVID-19 Vaccine (4 - 2025-26 season) 08/29/2024 (Originally 06/17/2024)   Hepatitis B Vaccines 19-59 Average Risk (1 of 3 - 19+ 3-dose series) 08/13/2025 (Originally 11/23/2004)   HPV VACCINES (1 - 3-dose SCDM series) 08/13/2025 (Originally 11/23/2012)   DTaP/Tdap/Td (2 - Td or Tdap) 09/02/2025   Pneumococcal Vaccine  Completed   Influenza Vaccine  Completed   Hepatitis C Screening  Completed   HIV Screening  Completed   Meningococcal B Vaccine  Aged Out    HM Colonoscopy   This patient has no relevant Health Maintenance data.    Medications: Current Outpatient Medications on File Prior to Visit  Medication Sig Dispense Refill   acetaminophen (TYLENOL) 500 MG tablet Take 1,000 mg by mouth every 4 (four) hours as needed for moderate pain.     allopurinol  (ZYLOPRIM ) 100 MG tablet Take 1 tablet (100 mg total) by mouth daily. 90 tablet 3    lisinopril  (ZESTRIL ) 5 MG tablet Take 1 tablet (5 mg total) by mouth daily. 90 tablet 1   No current facility-administered medications on file prior to visit.    Discussed the use of AI scribe software for clinical note transcription with the patient, who gave verbal consent to proceed.   Objective:   Vitals:   08/13/24 0821  BP: 120/80  Pulse: 84  Temp: 98.2 F (36.8 C)  SpO2: 97%   Vitals:   08/13/24 0821  Weight: 193 lb (87.5 kg)  Height: 5' 6 (1.676 m)   Body mass index is 31.15 kg/m.  General: Well Developed, well nourished, and in no acute distress.  Neuro: Alert and oriented x3, extra-ocular muscles intact, sensation grossly intact. Cranial nerves II through XII are grossly intact, motor, sensory, and coordinative functions are intact. HEENT: Normocephalic, atraumatic, neck supple, no masses, no lymphadenopathy, thyroid  nonenlarged. Oropharynx, nasopharynx, external ear canals are unremarkable. Skin: Warm and dry, no rashes noted.  Cardiac: Regular rate and rhythm, no murmurs rubs or gallops. No peripheral edema. Pulses symmetric. Respiratory: Clear to auscultation bilaterally. Speaking in full sentences.  Abdominal: Soft, nontender, nondistended, positive bowel sounds, no masses, no organomegaly. Musculoskeletal: Stable, and with full range of motion.  Impression and Recommendations:   The patient was counselled, risk factors were discussed, and anticipatory guidance given.  Problem List Items Addressed This Visit     Alcohol use disorder,  mild, abuse   We discussed his alcohol use, he finds some difficulty in abstaining from alcohol daily at home.  He is able to easily control his alcohol intake in social settings.  This is something that he would like to work on.  Alcohol use We discussed both pharmacologic and nonpharmacologic treatment options inclusive of naltrexone, cognitive behavioral therapy, commendation thereof.  He will review information  independently and contact us  if wanting to proceed.  If we pursue naltrexone, consider 25 to 50 mg/day for 6+ weeks - Patient can research naltrexone and contact us  if wanting to start this - Patient can contact us  if wanting to pursue cognitive [facility] therapy so that we can place a referral      Chronic gout of multiple sites   Very well-controlled with lifestyle and daily usage of allopurinol .  Gout - Continue daily allopurinol  and lifestyle choices      Relevant Orders   Uric acid   GAD (generalized anxiety disorder)   Excellent interval improvement in regards to mental health.  This is without the use of pharmacotherapy.  He attributes this to blood pressure control noting similar symptoms when blood pressure was not controlled.  GAD - Continue to monitor stress symptoms and contact us  if they progress/disrupt daily life to discuss options - Continue healthy lifestyle changes that allow for sustained blood pressure control      Healthcare maintenance - Primary   Annual examination completed, risk stratification labs ordered, anticipatory guidance provided.  We will follow labs once resulted.      Relevant Orders   CBC   Comprehensive metabolic panel with GFR   Hemoglobin A1c   Lipid panel   Pneumococcal conjugate vaccine 20-valent (Completed)   Hypertension   Very well-controlled on current lisinopril  5 mg regimen which he is compliant with.  Hypertension - Continue healthy lifestyle changes - Continue lisinopril  5 mg daily      IBS (irritable bowel syndrome)   Currently asymptomatic        Orders & Medications Medications: No orders of the defined types were placed in this encounter.  Orders Placed This Encounter  Procedures   Pneumococcal conjugate vaccine 20-valent   CBC   Comprehensive metabolic panel with GFR   Hemoglobin A1c   Lipid panel   Uric acid     No follow-ups on file.    Selinda JINNY Ku, MD, [facility]   Primary Care Sports  Medicine Primary Care and Sports Medicine at [name]

## 2024-08-13 NOTE — Patient Instructions (Addendum)
-   Obtain fasting labs with orders provided (can have water or black coffee but otherwise no food or drink x 8 hours before labs) - Review information provided - Attend eye doctor annually, dentist every 6 months, work towards or maintain 30 minutes of moderate intensity physical activity at least 5 days per week, and consume a balanced diet - Return in 1 year for physical - Contact us  for any questions between now and then   Patient Plan  Alcohol use - Review information about naltrexone and contact the office if you want to start this medication. - Contact the office if you are interested in cognitive behavioral therapy so a referral can be placed. - Continue to work on reducing alcohol use at home.  Gout - Continue taking allopurinol  daily as prescribed. - Maintain current lifestyle choices to help control gout. - Complete the ordered uric acid lab.  Generalized anxiety disorder (GAD) - Continue to monitor stress and anxiety symptoms. - Contact the office if symptoms worsen or begin to disrupt daily life. - Maintain healthy lifestyle changes that help with blood pressure and anxiety.  Hypertension - Continue taking lisinopril  5 mg daily. - Maintain healthy lifestyle habits for blood pressure control.  Irritable bowel syndrome (IBS) - No current symptoms. No action needed at this time.  Red flags - seek medical attention if you experience: - Severe or persistent abdominal pain - Blood in your stool or black, tarry stools - Severe joint pain or swelling - Sudden changes in mood or behavior - Signs of alcohol withdrawal (tremors, confusion, hallucinations)  If you have questions or your symptoms do not improve, schedule a follow-up appointment.

## 2024-08-13 NOTE — Assessment & Plan Note (Signed)
 Very well-controlled on current lisinopril  5 mg regimen which he is compliant with.  Hypertension - Continue healthy lifestyle changes - Continue lisinopril  5 mg daily

## 2024-08-13 NOTE — Assessment & Plan Note (Signed)
 Annual examination completed, risk stratification labs ordered, anticipatory guidance provided.  We will follow labs once resulted.

## 2024-08-13 NOTE — Assessment & Plan Note (Signed)
 We discussed his alcohol use, he finds some difficulty in abstaining from alcohol daily at home.  He is able to easily control his alcohol intake in social settings.  This is something that he would like to work on.  Alcohol use We discussed both pharmacologic and nonpharmacologic treatment options inclusive of naltrexone, cognitive behavioral therapy, commendation thereof.  He will review information independently and contact us  if wanting to proceed.  If we pursue naltrexone, consider 25 to 50 mg/day for 6+ weeks - Patient can research naltrexone and contact us  if wanting to start this - Patient can contact us  if wanting to pursue cognitive [facility] therapy so that we can place a referral

## 2024-08-13 NOTE — Assessment & Plan Note (Signed)
 Currently asymptomatic

## 2024-08-13 NOTE — Assessment & Plan Note (Signed)
 Excellent interval improvement in regards to mental health.  This is without the use of pharmacotherapy.  He attributes this to blood pressure control noting similar symptoms when blood pressure was not controlled.  GAD - Continue to monitor stress symptoms and contact us  if they progress/disrupt daily life to discuss options - Continue healthy lifestyle changes that allow for sustained blood pressure control

## 2024-08-13 NOTE — Assessment & Plan Note (Signed)
 Very well-controlled with lifestyle and daily usage of allopurinol .  Gout - Continue daily allopurinol  and lifestyle choices

## 2024-08-16 DIAGNOSIS — Z0000 Encounter for general adult medical examination without abnormal findings: Secondary | ICD-10-CM | POA: Diagnosis not present

## 2024-08-16 DIAGNOSIS — M1A09X0 Idiopathic chronic gout, multiple sites, without tophus (tophi): Secondary | ICD-10-CM | POA: Diagnosis not present

## 2024-08-17 LAB — COMPREHENSIVE METABOLIC PANEL WITH GFR
ALT: 26 IU/L (ref 0–44)
AST: 18 IU/L (ref 0–40)
Albumin: 4.9 g/dL (ref 4.1–5.1)
Alkaline Phosphatase: 90 IU/L (ref 47–123)
BUN/Creatinine Ratio: 11 (ref 9–20)
BUN: 13 mg/dL (ref 6–20)
Bilirubin Total: 0.4 mg/dL (ref 0.0–1.2)
CO2: 21 mmol/L (ref 20–29)
Calcium: 9.6 mg/dL (ref 8.7–10.2)
Chloride: 99 mmol/L (ref 96–106)
Creatinine, Ser: 1.2 mg/dL (ref 0.76–1.27)
Globulin, Total: 2.6 g/dL (ref 1.5–4.5)
Glucose: 120 mg/dL — ABNORMAL HIGH (ref 70–99)
Potassium: 4.5 mmol/L (ref 3.5–5.2)
Sodium: 136 mmol/L (ref 134–144)
Total Protein: 7.5 g/dL (ref 6.0–8.5)
eGFR: 79 mL/min/1.73 (ref >59)

## 2024-08-17 LAB — CBC
Hematocrit: 45.6 % (ref 37.5–51.0)
Hemoglobin: 15.3 g/dL (ref 13.0–17.7)
MCH: 31.5 pg (ref 26.6–33.0)
MCHC: 33.6 g/dL (ref 31.5–35.7)
MCV: 94 fL (ref 79–97)
Platelets: 189 x10E3/uL (ref 150–450)
RBC: 4.86 x10E6/uL (ref 4.14–5.80)
RDW: 12.1 % (ref 11.6–15.4)
WBC: 6.3 x10E3/uL (ref 3.4–10.8)

## 2024-08-17 LAB — LIPID PANEL
Chol/HDL Ratio: 5.5 ratio — ABNORMAL HIGH (ref 0.0–5.0)
Cholesterol, Total: 221 mg/dL — ABNORMAL HIGH (ref 100–199)
HDL: 40 mg/dL (ref >39)
LDL Chol Calc (NIH): 157 mg/dL — ABNORMAL HIGH (ref 0–99)
Triglycerides: 134 mg/dL (ref 0–149)
VLDL Cholesterol Cal: 24 mg/dL (ref 5–40)

## 2024-08-17 LAB — HEMOGLOBIN A1C
Est. average glucose Bld gHb Est-mCnc: 100 mg/dL
Hgb A1c MFr Bld: 5.1 % (ref 4.8–5.6)

## 2024-08-17 LAB — URIC ACID: Uric Acid: 6.7 mg/dL (ref 3.8–8.4)

## 2024-08-19 ENCOUNTER — Ambulatory Visit: Payer: Self-pay | Admitting: Family Medicine

## 2024-09-08 ENCOUNTER — Other Ambulatory Visit: Payer: Self-pay | Admitting: Family Medicine

## 2024-09-08 DIAGNOSIS — I158 Other secondary hypertension: Secondary | ICD-10-CM

## 2024-09-09 NOTE — Telephone Encounter (Signed)
 Requested Prescriptions  Pending Prescriptions Disp Refills   lisinopril  (ZESTRIL ) 5 MG tablet [Pharmacy Med Name: LISINOPRIL  5 MG TABLET] 90 tablet 1    Sig: TAKE 1 TABLET (5 MG TOTAL) BY MOUTH DAILY.     Cardiovascular:  ACE Inhibitors Passed - 09/09/2024  4:51 PM      Passed - Cr in normal range and within 180 days    Creatinine, Ser  Date Value Ref Range Status  08/16/2024 1.20 0.76 - 1.27 mg/dL Final         Passed - K in normal range and within 180 days    Potassium  Date Value Ref Range Status  08/16/2024 4.5 3.5 - 5.2 mmol/L Final         Passed - Patient is not pregnant      Passed - Last BP in normal range    BP Readings from Last 1 Encounters:  08/13/24 120/80         Passed - Valid encounter within last 6 months    Recent Outpatient Visits           3 weeks ago Healthcare maintenance   [facility] at [facility], Selinda PARAS, MD   7 months ago GAD (generalized anxiety disorder)   [facility] at [facility], Selinda PARAS, MD   8 months ago Other secondary hypertension   [facility] at [facility], Jason J, MD

## 2025-08-14 ENCOUNTER — Encounter: Admitting: Family Medicine
